# Patient Record
Sex: Female | Born: 1945 | Hispanic: No | Marital: Married | Smoking: Never smoker
Health system: Southern US, Community
[De-identification: ages and names within clinical notes are randomized; demographics above are authoritative.]

## PROBLEM LIST (undated history)

## (undated) DIAGNOSIS — I5030 Unspecified diastolic (congestive) heart failure: Secondary | ICD-10-CM

## (undated) DIAGNOSIS — I341 Nonrheumatic mitral (valve) prolapse: Secondary | ICD-10-CM

## (undated) DIAGNOSIS — N183 Chronic kidney disease, stage 3 unspecified: Secondary | ICD-10-CM

## (undated) DIAGNOSIS — E785 Hyperlipidemia, unspecified: Secondary | ICD-10-CM

## (undated) DIAGNOSIS — F32A Depression, unspecified: Secondary | ICD-10-CM

## (undated) DIAGNOSIS — R011 Cardiac murmur, unspecified: Secondary | ICD-10-CM

## (undated) DIAGNOSIS — F329 Major depressive disorder, single episode, unspecified: Secondary | ICD-10-CM

## (undated) DIAGNOSIS — I10 Essential (primary) hypertension: Secondary | ICD-10-CM

---

## 1979-02-28 HISTORY — PX: TUBAL LIGATION: SHX77

## 2004-03-06 ENCOUNTER — Other Ambulatory Visit: Admission: RE | Admit: 2004-03-06 | Discharge: 2004-03-06 | Payer: Self-pay | Admitting: Family Medicine

## 2005-03-10 ENCOUNTER — Other Ambulatory Visit: Admission: RE | Admit: 2005-03-10 | Discharge: 2005-03-10 | Payer: Self-pay | Admitting: Family Medicine

## 2006-03-22 ENCOUNTER — Other Ambulatory Visit: Admission: RE | Admit: 2006-03-22 | Discharge: 2006-03-22 | Payer: Self-pay | Admitting: Family Medicine

## 2007-04-06 ENCOUNTER — Other Ambulatory Visit: Admission: RE | Admit: 2007-04-06 | Discharge: 2007-04-06 | Payer: Self-pay | Admitting: Family Medicine

## 2009-10-08 ENCOUNTER — Other Ambulatory Visit: Admission: RE | Admit: 2009-10-08 | Discharge: 2009-10-08 | Payer: Self-pay | Admitting: Family Medicine

## 2013-05-22 ENCOUNTER — Encounter (HOSPITAL_COMMUNITY): Payer: Self-pay | Admitting: Emergency Medicine

## 2013-05-22 ENCOUNTER — Inpatient Hospital Stay (HOSPITAL_COMMUNITY)
Admission: EM | Admit: 2013-05-22 | Discharge: 2013-05-30 | DRG: 493 | Disposition: A | Discharge status: Skilled Nursing Facility | Payer: Medicare Other | Attending: Orthopedic Surgery | Admitting: Orthopedic Surgery

## 2013-05-22 ENCOUNTER — Emergency Department (HOSPITAL_COMMUNITY): Payer: Medicare Other

## 2013-05-22 ENCOUNTER — Encounter (HOSPITAL_COMMUNITY): Payer: Medicare Other | Admitting: Anesthesiology

## 2013-05-22 ENCOUNTER — Encounter (HOSPITAL_COMMUNITY)
Admission: EM | Disposition: A | Discharge status: Skilled Nursing Facility | Payer: Self-pay | Source: Home / Self Care | Attending: Orthopedic Surgery

## 2013-05-22 ENCOUNTER — Emergency Department (HOSPITAL_COMMUNITY): Payer: Medicare Other | Admitting: Anesthesiology

## 2013-05-22 DIAGNOSIS — E785 Hyperlipidemia, unspecified: Secondary | ICD-10-CM | POA: Diagnosis present

## 2013-05-22 DIAGNOSIS — N183 Chronic kidney disease, stage 3 unspecified: Secondary | ICD-10-CM | POA: Diagnosis present

## 2013-05-22 DIAGNOSIS — F3289 Other specified depressive episodes: Secondary | ICD-10-CM | POA: Diagnosis present

## 2013-05-22 DIAGNOSIS — Z79899 Other long term (current) drug therapy: Secondary | ICD-10-CM

## 2013-05-22 DIAGNOSIS — D62 Acute posthemorrhagic anemia: Secondary | ICD-10-CM | POA: Diagnosis not present

## 2013-05-22 DIAGNOSIS — I059 Rheumatic mitral valve disease, unspecified: Secondary | ICD-10-CM | POA: Diagnosis present

## 2013-05-22 DIAGNOSIS — E871 Hypo-osmolality and hyponatremia: Secondary | ICD-10-CM | POA: Diagnosis present

## 2013-05-22 DIAGNOSIS — W11XXXA Fall on and from ladder, initial encounter: Secondary | ICD-10-CM | POA: Diagnosis present

## 2013-05-22 DIAGNOSIS — I129 Hypertensive chronic kidney disease with stage 1 through stage 4 chronic kidney disease, or unspecified chronic kidney disease: Secondary | ICD-10-CM | POA: Diagnosis present

## 2013-05-22 DIAGNOSIS — N289 Disorder of kidney and ureter, unspecified: Secondary | ICD-10-CM

## 2013-05-22 DIAGNOSIS — Z7982 Long term (current) use of aspirin: Secondary | ICD-10-CM

## 2013-05-22 DIAGNOSIS — S82892C Other fracture of left lower leg, initial encounter for open fracture type IIIA, IIIB, or IIIC: Secondary | ICD-10-CM

## 2013-05-22 DIAGNOSIS — F329 Major depressive disorder, single episode, unspecified: Secondary | ICD-10-CM | POA: Diagnosis present

## 2013-05-22 DIAGNOSIS — S82892B Other fracture of left lower leg, initial encounter for open fracture type I or II: Secondary | ICD-10-CM

## 2013-05-22 DIAGNOSIS — S82899B Other fracture of unspecified lower leg, initial encounter for open fracture type I or II: Principal | ICD-10-CM | POA: Diagnosis present

## 2013-05-22 HISTORY — DX: Chronic kidney disease, stage 3 (moderate): N18.3

## 2013-05-22 HISTORY — PX: ORIF ANKLE FRACTURE: SHX5408

## 2013-05-22 HISTORY — DX: Unspecified diastolic (congestive) heart failure: I50.30

## 2013-05-22 HISTORY — DX: Major depressive disorder, single episode, unspecified: F32.9

## 2013-05-22 HISTORY — PX: ORIF ANKLE FRACTURE: SUR919

## 2013-05-22 HISTORY — DX: Nonrheumatic mitral (valve) prolapse: I34.1

## 2013-05-22 HISTORY — DX: Hyperlipidemia, unspecified: E78.5

## 2013-05-22 HISTORY — DX: Depression, unspecified: F32.A

## 2013-05-22 HISTORY — DX: Chronic kidney disease, stage 3 unspecified: N18.30

## 2013-05-22 HISTORY — PX: I & D EXTREMITY: SHX5045

## 2013-05-22 HISTORY — DX: Essential (primary) hypertension: I10

## 2013-05-22 HISTORY — DX: Cardiac murmur, unspecified: R01.1

## 2013-05-22 LAB — CBC WITH DIFFERENTIAL/PLATELET
Basophils Absolute: 0 10*3/uL (ref 0.0–0.1)
Basophils Relative: 0 % (ref 0–1)
Eosinophils Absolute: 0 10*3/uL (ref 0.0–0.7)
Hemoglobin: 11.8 g/dL — ABNORMAL LOW (ref 12.0–15.0)
Lymphs Abs: 1.4 10*3/uL (ref 0.7–4.0)
MCH: 28.8 pg (ref 26.0–34.0)
MCHC: 32.8 g/dL (ref 30.0–36.0)
Monocytes Relative: 6 % (ref 3–12)
Neutro Abs: 11.8 10*3/uL — ABNORMAL HIGH (ref 1.7–7.7)
Neutrophils Relative %: 84 % — ABNORMAL HIGH (ref 43–77)
Platelets: 216 10*3/uL (ref 150–400)
RDW: 14.6 % (ref 11.5–15.5)
WBC: 14 10*3/uL — ABNORMAL HIGH (ref 4.0–10.5)

## 2013-05-22 LAB — BASIC METABOLIC PANEL
BUN: 19 mg/dL (ref 6–23)
Chloride: 96 mEq/L (ref 96–112)
Creatinine, Ser: 1.14 mg/dL — ABNORMAL HIGH (ref 0.50–1.10)
GFR calc Af Amer: 57 mL/min — ABNORMAL LOW (ref >90)
GFR calc non Af Amer: 49 mL/min — ABNORMAL LOW (ref >90)
Glucose, Bld: 119 mg/dL — ABNORMAL HIGH (ref 70–99)
Potassium: 3.6 mEq/L (ref 3.5–5.1)

## 2013-05-22 SURGERY — OPEN REDUCTION INTERNAL FIXATION (ORIF) ANKLE FRACTURE
Anesthesia: General | Site: Ankle | Laterality: Left | Wound class: Dirty or Infected

## 2013-05-22 MED ORDER — PHENYLEPHRINE HCL 10 MG/ML IJ SOLN
INTRAMUSCULAR | Status: DC | PRN
  Administered 2013-05-22 (×3): 80 ug via INTRAVENOUS

## 2013-05-22 MED ORDER — LIDOCAINE HCL (CARDIAC) 20 MG/ML IV SOLN
INTRAVENOUS | Status: DC | PRN
  Administered 2013-05-22: 100 mg via INTRAVENOUS

## 2013-05-22 MED ORDER — FENTANYL CITRATE 0.05 MG/ML IJ SOLN: 50.0000 ug | Freq: Once | INTRAMUSCULAR | Status: DC

## 2013-05-22 MED ORDER — LACTATED RINGERS IV SOLN
INTRAVENOUS | Status: DC | PRN
  Administered 2013-05-22 (×2): via INTRAVENOUS

## 2013-05-22 MED ORDER — FENTANYL CITRATE 0.05 MG/ML IJ SOLN
INTRAMUSCULAR | Status: AC
  Administered 2013-05-22: 50 ug
  Filled 2013-05-22: qty 2

## 2013-05-22 MED ORDER — CEFAZOLIN SODIUM 1-5 GM-% IV SOLN
INTRAVENOUS | Status: DC | PRN
  Administered 2013-05-22: 1 g via INTRAVENOUS

## 2013-05-22 MED ORDER — PROPOFOL 10 MG/ML IV BOLUS
INTRAVENOUS | Status: DC | PRN
  Administered 2013-05-22: 100 mg via INTRAVENOUS

## 2013-05-22 MED ORDER — SODIUM CHLORIDE 0.9 % IR SOLN
Status: DC | PRN
  Administered 2013-05-22: 1000 mL

## 2013-05-22 MED ORDER — CEFAZOLIN SODIUM 1-5 GM-% IV SOLN
1.0000 g | Freq: Once | INTRAVENOUS | Status: AC
  Administered 2013-05-22: 1 g via INTRAVENOUS
  Filled 2013-05-22: qty 50

## 2013-05-22 MED ORDER — TETANUS-DIPHTH-ACELL PERTUSSIS 5-2.5-18.5 LF-MCG/0.5 IM SUSP
0.5000 mL | Freq: Once | INTRAMUSCULAR | Status: AC
  Administered 2013-05-22: 0.5 mL via INTRAMUSCULAR
  Filled 2013-05-22: qty 0.5

## 2013-05-22 MED ORDER — ONDANSETRON HCL 4 MG/2ML IJ SOLN
4.0000 mg | Freq: Once | INTRAMUSCULAR | Status: AC
  Administered 2013-05-22: 4 mg via INTRAVENOUS
  Filled 2013-05-22: qty 2

## 2013-05-22 MED ORDER — SUCCINYLCHOLINE CHLORIDE 20 MG/ML IJ SOLN
INTRAMUSCULAR | Status: DC | PRN
  Administered 2013-05-22: 100 mg via INTRAVENOUS

## 2013-05-22 MED ORDER — EPHEDRINE SULFATE 50 MG/ML IJ SOLN
INTRAMUSCULAR | Status: DC | PRN
  Administered 2013-05-22 (×2): 10 mg via INTRAVENOUS

## 2013-05-22 MED ORDER — HYDROMORPHONE HCL PF 1 MG/ML IJ SOLN
1.0000 mg | Freq: Once | INTRAMUSCULAR | Status: AC
  Administered 2013-05-22: 1 mg via INTRAVENOUS
  Filled 2013-05-22: qty 1

## 2013-05-22 MED ORDER — FENTANYL CITRATE 0.05 MG/ML IJ SOLN
INTRAMUSCULAR | Status: DC | PRN
  Administered 2013-05-22: 75 ug via INTRAVENOUS
  Administered 2013-05-23: 25 ug via INTRAVENOUS

## 2013-05-22 MED ORDER — CEFAZOLIN SODIUM 1-5 GM-% IV SOLN
INTRAVENOUS | Status: AC
  Filled 2013-05-22: qty 50

## 2013-05-22 SURGICAL SUPPLY — 59 items
BANDAGE ELASTIC 6 VELCRO ST LF (GAUZE/BANDAGES/DRESSINGS) ×3 IMPLANT
BANDAGE GAUZE ELAST BULKY 4 IN (GAUZE/BANDAGES/DRESSINGS) ×3 IMPLANT
BIT DRILL CANN 2.7 (BIT) ×1
BIT DRILL SRG 2.7XCANN AO CPLG (BIT) ×2 IMPLANT
BIT DRL SRG 2.7XCANN AO CPLNG (BIT) ×2
BLADE SURG 10 STRL SS (BLADE) ×3 IMPLANT
BNDG COHESIVE 4X5 TAN STRL (GAUZE/BANDAGES/DRESSINGS) ×3 IMPLANT
BNDG ESMARK 4X9 LF (GAUZE/BANDAGES/DRESSINGS) ×3 IMPLANT
CANISTER SUCTION 2500CC (MISCELLANEOUS) ×3 IMPLANT
CLOTH BEACON ORANGE TIMEOUT ST (SAFETY) ×3 IMPLANT
COVER SURGICAL LIGHT HANDLE (MISCELLANEOUS) ×3 IMPLANT
CUFF TOURNIQUET SINGLE 34IN LL (TOURNIQUET CUFF) ×3 IMPLANT
DRAPE C-ARM 42X72 X-RAY (DRAPES) ×3 IMPLANT
DRAPE C-ARMOR (DRAPES) ×3 IMPLANT
DRILL 2.6X122MM WL AO SHAFT (BIT) ×6 IMPLANT
DRSG ADAPTIC 3X8 NADH LF (GAUZE/BANDAGES/DRESSINGS) ×3 IMPLANT
DURAPREP 26ML APPLICATOR (WOUND CARE) ×3 IMPLANT
ELECT REM PT RETURN 9FT ADLT (ELECTROSURGICAL)
ELECTRODE REM PT RTRN 9FT ADLT (ELECTROSURGICAL) IMPLANT
FACESHIELD LNG OPTICON STERILE (SAFETY) ×9 IMPLANT
GLOVE BIO SURGEON STRL SZ8 (GLOVE) ×6 IMPLANT
GLOVE ORTHO TXT STRL SZ7.5 (GLOVE) ×9 IMPLANT
GOWN PREVENTION PLUS XXLARGE (GOWN DISPOSABLE) ×3 IMPLANT
GOWN STRL NON-REIN LRG LVL3 (GOWN DISPOSABLE) ×9 IMPLANT
K-WIRE ORTHOPEDIC 1.4X150L (WIRE) ×6
KIT BASIN OR (CUSTOM PROCEDURE TRAY) ×3 IMPLANT
KWIRE ORTHOPEDIC 1.4X150L (WIRE) ×4 IMPLANT
NS IRRIG 1000ML POUR BTL (IV SOLUTION) ×3 IMPLANT
PACK ORTHO EXTREMITY (CUSTOM PROCEDURE TRAY) ×3 IMPLANT
PAD ARMBOARD 7.5X6 YLW CONV (MISCELLANEOUS) ×6 IMPLANT
PAD CAST 4YDX4 CTTN HI CHSV (CAST SUPPLIES) ×2 IMPLANT
PADDING CAST ABS 4INX4YD NS (CAST SUPPLIES) ×1
PADDING CAST ABS COTTON 4X4 ST (CAST SUPPLIES) ×2 IMPLANT
PADDING CAST COTTON 4X4 STRL (CAST SUPPLIES) ×1
PENCIL BUTTON HOLSTER BLD 10FT (ELECTRODE) IMPLANT
PLATE 7H 96MM (Plate) ×3 IMPLANT
SCREW 3.5X10MM (Screw) ×3 IMPLANT
SCREW BONE 14MMX3.5MM (Screw) ×3 IMPLANT
SCREW BONE 3.5X16MM (Screw) ×3 IMPLANT
SCREW BONE 50MMX3.5MM (Screw) ×3 IMPLANT
SCREW CANNULATED TI 4.0X44 (Screw) ×6 IMPLANT
SCREW LOCK 3.5X14 (Screw) ×3 IMPLANT
SCREW LOCKING 3.5X40 (Screw) ×3 IMPLANT
SPONGE GAUZE 4X4 12PLY (GAUZE/BANDAGES/DRESSINGS) ×3 IMPLANT
SPONGE LAP 18X18 X RAY DECT (DISPOSABLE) IMPLANT
STOCKINETTE IMPERVIOUS 9X36 MD (GAUZE/BANDAGES/DRESSINGS) ×3 IMPLANT
SUT ETHILON 3 0 PS 1 (SUTURE) ×6 IMPLANT
SUT MNCRL AB 4-0 PS2 18 (SUTURE) ×3 IMPLANT
SUT MON AB 2-0 CT1 36 (SUTURE) ×3 IMPLANT
SUT VIC AB 0 CT1 27 (SUTURE) ×1
SUT VIC AB 0 CT1 27XBRD ANBCTR (SUTURE) ×2 IMPLANT
SYR BULB IRRIGATION 50ML (SYRINGE) ×3 IMPLANT
TOWEL OR 17X24 6PK STRL BLUE (TOWEL DISPOSABLE) ×6 IMPLANT
TOWEL OR 17X26 10 PK STRL BLUE (TOWEL DISPOSABLE) ×3 IMPLANT
TUBE CONNECTING 12X1/4 (SUCTIONS) ×3 IMPLANT
UNDERPAD 30X30 INCONTINENT (UNDERPADS AND DIAPERS) ×3 IMPLANT
WASHER FOR 4.0 SCREWS (Washer) ×3 IMPLANT
WATER STERILE IRR 1000ML POUR (IV SOLUTION) ×3 IMPLANT
YANKAUER SUCT BULB TIP NO VENT (SUCTIONS) ×3 IMPLANT

## 2013-05-22 NOTE — Preoperative (Signed)
Beta Blockers   Reason not to administer Beta Blockers:Not Applicable 

## 2013-05-22 NOTE — ED Notes (Signed)
Per EMS pt fell off off ladder about 3 feet; states landed on feet possible left open ankle fx. Pt received 6mg  Morphine prior to arrival .

## 2013-05-22 NOTE — Anesthesia Preprocedure Evaluation (Signed)
Anesthesia Evaluation  Patient identified by MRN, date of birth, ID band Patient awake    Reviewed: Allergy & Precautions, H&P , NPO status , Patient's Chart, lab work & pertinent test results  Airway Mallampati: I TM Distance: >3 FB Neck ROM: full    Dental   Pulmonary          Cardiovascular hypertension, + Valvular Problems/Murmurs MVP Rhythm:regular Rate:Normal     Neuro/Psych    GI/Hepatic   Endo/Other    Renal/GU Renal disease     Musculoskeletal   Abdominal   Peds  Hematology   Anesthesia Other Findings   Reproductive/Obstetrics                           Anesthesia Physical Anesthesia Plan  ASA: II  Anesthesia Plan: General   Post-op Pain Management:    Induction: Intravenous  Airway Management Planned: Oral ETT and LMA  Additional Equipment:   Intra-op Plan:   Post-operative Plan: Extubation in OR  Informed Consent: I have reviewed the patients History and Physical, chart, labs and discussed the procedure including the risks, benefits and alternatives for the proposed anesthesia with the patient or authorized representative who has indicated his/her understanding and acceptance.     Plan Discussed with: CRNA, Anesthesiologist and Surgeon  Anesthesia Plan Comments:         Anesthesia Quick Evaluation

## 2013-05-22 NOTE — Anesthesia Procedure Notes (Addendum)
Procedure Name: Intubation Date/Time: 05/22/2013 11:02 PM Performed by: Molli Hazard Pre-anesthesia Checklist: Patient identified, Emergency Drugs available, Suction available and Patient being monitored Patient Re-evaluated:Patient Re-evaluated prior to inductionOxygen Delivery Method: Circle system utilized Preoxygenation: Pre-oxygenation with 100% oxygen Intubation Type: IV induction Ventilation: Mask ventilation without difficulty Laryngoscope Size: Miller and 2 Grade View: Grade I Tube type: Oral Tube size: 7.5 mm Number of attempts: 1 Airway Equipment and Method: Stylet Placement Confirmation: ETT inserted through vocal cords under direct vision,  positive ETCO2 and breath sounds checked- equal and bilateral Secured at: 20 cm Tube secured with: Tape Dental Injury: Teeth and Oropharynx as per pre-operative assessment    Performed by: Molli Hazard

## 2013-05-22 NOTE — ED Notes (Signed)
Patient requests pain medication.

## 2013-05-22 NOTE — ED Provider Notes (Signed)
CSN: [id number]     Arrival date & time 05/22/13  1902 History   First MD Initiated Contact with Patient 05/22/13 2121     Chief Complaint  Patient presents with  . Fall   (Consider location/radiation/quality/duration/timing/severity/associated sxs/prior Treatment) Patient is a 67 y.o. female presenting with fall. The history is provided by the patient.  Fall  She  [name] about 4 feet off of a ladder suffering an injury to her left foot. She was brought in by EMS with obvious open fracture to her left ankle. She denies other injury. She received morphine and root but still rates pain at 4/10. It feels better she keeps her foot still and is worse with any movement. She denies other injury. She denies loss of consciousness and denies head, neck, back, chest, abdomen injury. She does not on her last tetanus immunization was.  Past Medical History  Diagnosis Date  . Renal disorder     Stage III  . Mitral valve prolapse   . Hypertension   . Hyperlipemia    No past surgical history on file. No family history on file. History  Substance Use Topics  . Smoking status: Not on file  . Smokeless tobacco: Not on file  . Alcohol Use: No   OB History   Grav Para Term Preterm Abortions TAB SAB Ect Mult Living                 Review of Systems  All other systems reviewed and are negative.    Allergies  Review of patient's allergies indicates no known allergies.  Home Medications   Current Outpatient Rx  Name  Route  Sig  Dispense  Refill  . buPROPion (WELLBUTRIN SR) 150 MG 12 hr tablet   Oral   Take 150 mg by mouth 2 (two) times daily.          Marland Kitchen FLUoxetine (PROZAC) 20 MG capsule   Oral   Take 20 mg by mouth daily.          Marland Kitchen lisinopril-hydrochlorothiazide (PRINZIDE,ZESTORETIC) 20-12.5 MG per tablet   Oral   Take 1 tablet by mouth daily.          Marland Kitchen omega-3 acid ethyl esters (LOVAZA) 1 G capsule   Oral   Take 1 g by mouth 2 (two) times daily.         . pravastatin  (PRAVACHOL) 80 MG tablet   Oral   Take 80 mg by mouth daily.          . raloxifene (EVISTA) 60 MG tablet   Oral   Take 60 mg by mouth daily.           BP 120/56  Pulse 99  Temp(Src) 98 F (36.7 C) (Oral)  Resp 16  Ht 5\' 4"  (1.626 m)  Wt 140 lb (63.504 kg)  BMI 24.02 kg/m2  SpO2 95% Physical Exam  Nursing note and vitals reviewed.  67 year old female, resting comfortably and in no acute distress. Vital signs are normal. Oxygen saturation is 95%, which is normal. Head is normocephalic and atraumatic. PERRLA, EOMI. Oropharynx is clear. Neck is nontender and immobilized in a stiff cervical collar. There is no adenopathy or JVD. Back is nontender and there is no CVA tenderness. Lungs are clear without rales, wheezes, or rhonchi. Chest is nontender. Heart has regular rate and rhythm without murmur. Abdomen is soft, flat, nontender without masses or hepatosplenomegaly and peristalsis is normoactive. Extremities: There is an obvious open fracture  dislocation of the left ankle with the tibia protruding through a large laceration of the medial aspect of the ankle. The foot is rotated outward. Distal neurovascular exam is intact with prompt capillary refill normal sensation. Skin is warm and dry without rash. Neurologic: Mental status is normal, cranial nerves are intact, there are no motor or sensory deficits.  ED Course  Procedures (including critical care time) Labs Review Results for orders placed during the hospital encounter of 05/22/13  CBC WITH DIFFERENTIAL      Result Value Range   WBC 14.0 (*) 4.0 - 10.5 K/uL   RBC 4.10  3.87 - 5.11 MIL/uL   Hemoglobin 11.8 (*) 12.0 - 15.0 g/dL   HCT 45.4  09.8 - 11.9 %   MCV 87.8  78.0 - 100.0 fL   MCH 28.8  26.0 - 34.0 pg   MCHC 32.8  30.0 - 36.0 g/dL   RDW 14.7  82.9 - 56.2 %   Platelets 216  150 - 400 K/uL   Neutrophils Relative % 84 (*) 43 - 77 %   Neutro Abs 11.8 (*) 1.7 - 7.7 K/uL   Lymphocytes Relative 10 (*) 12 - 46 %    Lymphs Abs 1.4  0.7 - 4.0 K/uL   Monocytes Relative 6  3 - 12 %   Monocytes Absolute 0.8  0.1 - 1.0 K/uL   Eosinophils Relative 0  0 - 5 %   Eosinophils Absolute 0.0  0.0 - 0.7 K/uL   Basophils Relative 0  0 - 1 %   Basophils Absolute 0.0  0.0 - 0.1 K/uL  BASIC METABOLIC PANEL      Result Value Range   Sodium 133 (*) 135 - 145 mEq/L   Potassium 3.6  3.5 - 5.1 mEq/L   Chloride 96  96 - 112 mEq/L   CO2 27  19 - 32 mEq/L   Glucose, Bld 119 (*) 70 - 99 mg/dL   BUN 19  6 - 23 mg/dL   Creatinine, Ser 1.30 (*) 0.50 - 1.10 mg/dL   Calcium 9.2  8.4 - 86.5 mg/dL   GFR calc non Af Amer 49 (*) >90 mL/min   GFR calc Af Amer 57 (*) >90 mL/min   Imaging Review Dg Ankle 2 Views Left  05/22/2013   CLINICAL DATA:  Recent traumatic injury with ankle pain  EXAM: LEFT ANKLE - 2 VIEW  COMPARISON:  None.  FINDINGS: There fractures of both the distal tibia and distal fibula. Significant deformation is noted with posterior displacement of the tibia with respect to the talus as well as apparent lateral displacement and rotation of the foot with respect to the tibia. The distal fibular fragment appears to be appropriately located with respect to the talus.  IMPRESSION: Significant fracture dislocation of the ankle joint with apparent posterior medial displacement of the tibia with respect to the talus and remainder of the foot.   Electronically Signed   By: Alcide Clever M.D.   On: 05/22/2013 21:13   Images viewed by me.  EKG Interpretation    Date/Time:  Monday May 22 2013 21:51:26 EST Ventricular Rate:  122 PR Interval:  161 QRS Duration: 69 QT Interval:  331 QTC Calculation: 471 R Axis:   35 Text Interpretation:  Sinus tachycardia Consider right atrial enlargement Low voltage, precordial leads Borderline repolarization abnormality No old tracing to compare Confirmed by [facility]  MD, Katherleen Folkes ([id number]) on 05/22/2013 10:12:45 PM  CRITICAL CARE Performed by: Dione Booze Total critical care  time: 35 minutes Critical care time was exclusive of separately billable procedures and treating other patients. Critical care was necessary to treat or prevent imminent or life-threatening deterioration. Critical care was time spent personally by me on the following activities: development of treatment plan with patient and/or surrogate as well as nursing, discussions with consultants, evaluation of patient's response to treatment, examination of patient, obtaining history from patient or surrogate, ordering and performing treatments and interventions, ordering and review of laboratory studies, ordering and review of radiographic studies, pulse oximetry and re-evaluation of patient's condition.  MDM   1. Hyponatremia   2. Renal insufficiency   3. Fracture dislocation or subluxation of ankle, left, open type III, initial encounter    Open fracture dislocation of the left ankle. Case is been discussed with Dr. Eulah Pont who is on call for orthopedics was requested that we attempt to reduce the fracture in the ED and he is coming into that the patient and consideration given to taking her to the operating room tonight. Attempt was made to reduce the ankle was unsuccessful. CT of the cervical spine is being delayed to allow emergent management of open fracture dislocation.    Dione Booze, MD 05/23/13 [phone]

## 2013-05-22 NOTE — ED Notes (Addendum)
Patient has an open ankle fracture that is a 10/10 pain scale. She is requesting pain medication relief.

## 2013-05-22 NOTE — Consult Note (Signed)
   ORTHOPAEDIC CONSULTATION  REQUESTING PHYSICIAN: David Glick, MD  Chief Complaint: Left ankle open fracture  HPI: Charnika Bourgoin is a 67 y.o. female who complains of  Fall from a ladder cleaning her gazebo. She lay on the ground for a few hours prior to being found.   Past Medical History  Diagnosis Date  . Renal disorder     Stage III  . Mitral valve prolapse   . Hypertension   . Hyperlipemia    No past surgical history on file. History   Social History  . Marital Status: Married    Spouse Name: N/A    Number of Children: N/A  . Years of Education: N/A   Social History Main Topics  . Smoking status: Not on file  . Smokeless tobacco: Not on file  . Alcohol Use: No  . Drug Use: No  . Sexual Activity: Not on file   Other Topics Concern  . Not on file   Social History Narrative  . No narrative on file   No family history on file. No Known Allergies Prior to Admission medications   Medication Sig Start Date End Date Taking? Authorizing Provider  buPROPion (WELLBUTRIN SR) 150 MG 12 hr tablet Take 150 mg by mouth 2 (two) times daily.  05/03/13  Yes Historical Provider, MD  FLUoxetine (PROZAC) 20 MG capsule Take 20 mg by mouth daily.  05/03/13  Yes Historical Provider, MD  lisinopril-hydrochlorothiazide (PRINZIDE,ZESTORETIC) 20-12.5 MG per tablet Take 1 tablet by mouth daily.  05/03/13  Yes Historical Provider, MD  omega-3 acid ethyl esters (LOVAZA) 1 G capsule Take 1 g by mouth 2 (two) times daily.   Yes Historical Provider, MD  pravastatin (PRAVACHOL) 80 MG tablet Take 80 mg by mouth daily.  05/03/13  Yes Historical Provider, MD  raloxifene (EVISTA) 60 MG tablet Take 60 mg by mouth daily.  05/03/13  Yes Historical Provider, MD   Dg Ankle 2 Views Left  05/22/2013   CLINICAL DATA:  Recent traumatic injury with ankle pain  EXAM: LEFT ANKLE - 2 VIEW  COMPARISON:  None.  FINDINGS: There fractures of both the distal tibia and distal fibula. Significant deformation is noted  with posterior displacement of the tibia with respect to the talus as well as apparent lateral displacement and rotation of the foot with respect to the tibia. The distal fibular fragment appears to be appropriately located with respect to the talus.  IMPRESSION: Significant fracture dislocation of the ankle joint with apparent posterior medial displacement of the tibia with respect to the talus and remainder of the foot.   Electronically Signed   By: Mark  Lukens M.D.   On: 05/22/2013 21:13    Positive ROS: All other systems have been reviewed and were otherwise negative with the exception of those mentioned in the HPI and as above.  Labs cbc No results found for this basename: WBC, HGB, HCT, PLT,  in the last 72 hours  Labs inflam No results found for this basename: ESR, CRP,  in the last 72 hours  Labs coag No results found for this basename: INR, PT, PTT,  in the last 72 hours  No results found for this basename: NA, K, CL, CO2, GLUCOSE, BUN, CREATININE, CALCIUM,  in the last 72 hours  Physical Exam: Filed Vitals:   05/22/13 2000  BP: 120/56  Pulse: 99  Temp:   Resp:    General: Alert, no acute distress Cardiovascular: No pedal edema Respiratory: No cyanosis, no use   of accessory musculature GI: No organomegaly, abdomen is soft and non-tender Skin: No lesions in the area of chief complaint Neurologic: Sensation intact distally Psychiatric: Patient is competent for consent with normal mood and affect Lymphatic: No axillary or cervical lymphadenopathy  MUSCULOSKELETAL:  LLE: medial laceration with exposed tiba. Wiggles toes, 2+DP, SILT DP/SP/S/S/T.  Other extremities are atraumatic with painless ROM and NVI.  Assessment: Left ankle fracture open  Plan: OR emergently for I&D, ex-fix vs. ORIF depending on skin status Weight Bearing Status: NWB LLE PT/OT VTE px: SCD's and ASA 325 post op.    Jadore Mcguffin, D, MD Cell [phone]   05/22/2013 9:50 PM     

## 2013-05-22 NOTE — ED Notes (Signed)
Dr. Preston Fleeting at bedside attempting to relocate tibia into ankle. Unable to do so.

## 2013-05-23 ENCOUNTER — Encounter (HOSPITAL_COMMUNITY): Payer: Self-pay | Admitting: *Deleted

## 2013-05-23 ENCOUNTER — Observation Stay (HOSPITAL_COMMUNITY): Payer: Medicare Other

## 2013-05-23 ENCOUNTER — Emergency Department (HOSPITAL_COMMUNITY): Payer: Medicare Other

## 2013-05-23 MED ORDER — CEFAZOLIN SODIUM-DEXTROSE 2-3 GM-% IV SOLR
2.0000 g | Freq: Four times a day (QID) | INTRAVENOUS | Status: AC
  Administered 2013-05-23 (×3): 2 g via INTRAVENOUS
  Filled 2013-05-23 (×3): qty 50

## 2013-05-23 MED ORDER — BUPROPION HCL ER (SR) 150 MG PO TB12
150.0000 mg | ORAL_TABLET | Freq: Two times a day (BID) | ORAL | Status: DC
  Administered 2013-05-23 – 2013-05-30 (×15): 150 mg via ORAL
  Filled 2013-05-23 (×17): qty 1

## 2013-05-23 MED ORDER — HYDROMORPHONE HCL PF 1 MG/ML IJ SOLN
INTRAMUSCULAR | Status: AC
  Filled 2013-05-23: qty 1

## 2013-05-23 MED ORDER — DEXTROSE-NACL 5-0.45 % IV SOLN
INTRAVENOUS | Status: AC
  Administered 2013-05-23: 04:00:00 via INTRAVENOUS

## 2013-05-23 MED ORDER — ONDANSETRON HCL 4 MG PO TABS: 4.0000 mg | ORAL_TABLET | Freq: Four times a day (QID) | ORAL | Status: DC | PRN

## 2013-05-23 MED ORDER — METOCLOPRAMIDE HCL 5 MG/ML IJ SOLN: 5.0000 mg | Freq: Three times a day (TID) | INTRAMUSCULAR | Status: DC | PRN

## 2013-05-23 MED ORDER — OXYCODONE HCL 5 MG PO TABS: 5.0000 mg | ORAL_TABLET | ORAL | Status: AC | PRN

## 2013-05-23 MED ORDER — METHOCARBAMOL 100 MG/ML IJ SOLN: 500.0000 mg | Freq: Four times a day (QID) | INTRAVENOUS | Status: DC | PRN

## 2013-05-23 MED ORDER — ACETAMINOPHEN 325 MG PO TABS
650.0000 mg | ORAL_TABLET | Freq: Four times a day (QID) | ORAL | Status: DC | PRN
  Administered 2013-05-23: 650 mg via ORAL
  Filled 2013-05-23 (×2): qty 2

## 2013-05-23 MED ORDER — OXYCODONE HCL 5 MG PO TABS
5.0000 mg | ORAL_TABLET | ORAL | Status: DC | PRN
  Administered 2013-05-23: 5 mg via ORAL
  Administered 2013-05-23 – 2013-05-24 (×7): 10 mg via ORAL
  Administered 2013-05-25: 5 mg via ORAL
  Administered 2013-05-25 (×2): 10 mg via ORAL
  Administered 2013-05-26: 5 mg via ORAL
  Administered 2013-05-26: 10 mg via ORAL
  Administered 2013-05-28: 5 mg via ORAL
  Administered 2013-05-29: 10 mg via ORAL
  Filled 2013-05-23 (×2): qty 2
  Filled 2013-05-23: qty 1
  Filled 2013-05-23 (×2): qty 2
  Filled 2013-05-23: qty 1
  Filled 2013-05-23 (×2): qty 2
  Filled 2013-05-23: qty 1
  Filled 2013-05-23 (×5): qty 2
  Filled 2013-05-23: qty 1

## 2013-05-23 MED ORDER — ONDANSETRON HCL 4 MG/2ML IJ SOLN
INTRAMUSCULAR | Status: DC | PRN
  Administered 2013-05-23: 4 mg via INTRAVENOUS

## 2013-05-23 MED ORDER — METHOCARBAMOL 500 MG PO TABS
500.0000 mg | ORAL_TABLET | Freq: Four times a day (QID) | ORAL | Status: DC | PRN
  Administered 2013-05-23 – 2013-05-29 (×8): 500 mg via ORAL
  Filled 2013-05-23 (×8): qty 1

## 2013-05-23 MED ORDER — FLUOXETINE HCL 20 MG PO CAPS
20.0000 mg | ORAL_CAPSULE | Freq: Every day | ORAL | Status: DC
  Administered 2013-05-23 – 2013-05-30 (×8): 20 mg via ORAL
  Filled 2013-05-23 (×9): qty 1

## 2013-05-23 MED ORDER — ONDANSETRON HCL 4 MG/2ML IJ SOLN: 4.0000 mg | Freq: Four times a day (QID) | INTRAMUSCULAR | Status: DC | PRN

## 2013-05-23 MED ORDER — ACETAMINOPHEN 325 MG PO TABS
650.0000 mg | ORAL_TABLET | ORAL | Status: DC | PRN
  Administered 2013-05-23 – 2013-05-25 (×3): 650 mg via ORAL
  Filled 2013-05-23: qty 2

## 2013-05-23 MED ORDER — ONDANSETRON HCL 4 MG/2ML IJ SOLN: 4.0000 mg | Freq: Once | INTRAMUSCULAR | Status: DC | PRN

## 2013-05-23 MED ORDER — METOCLOPRAMIDE HCL 5 MG PO TABS
5.0000 mg | ORAL_TABLET | Freq: Three times a day (TID) | ORAL | Status: DC | PRN
Start: 2013-05-23 — End: 2013-05-30

## 2013-05-23 MED ORDER — ASPIRIN EC 325 MG PO TBEC
325.0000 mg | DELAYED_RELEASE_TABLET | Freq: Every day | ORAL | Status: DC
  Administered 2013-05-23 – 2013-05-30 (×8): 325 mg via ORAL
  Filled 2013-05-23 (×9): qty 1

## 2013-05-23 MED ORDER — MORPHINE SULFATE 2 MG/ML IJ SOLN
1.0000 mg | INTRAMUSCULAR | Status: DC | PRN
  Administered 2013-05-23 – 2013-05-24 (×5): 1 mg via INTRAVENOUS
  Filled 2013-05-23 (×5): qty 1

## 2013-05-23 MED ORDER — HYDROMORPHONE HCL PF 1 MG/ML IJ SOLN
0.2500 mg | INTRAMUSCULAR | Status: DC | PRN
  Administered 2013-05-23 (×3): 0.5 mg via INTRAVENOUS

## 2013-05-23 MED ORDER — RALOXIFENE HCL 60 MG PO TABS
60.0000 mg | ORAL_TABLET | Freq: Every day | ORAL | Status: DC
  Administered 2013-05-23 – 2013-05-30 (×8): 60 mg via ORAL
  Filled 2013-05-23 (×8): qty 1

## 2013-05-23 NOTE — Transfer of Care (Signed)
Immediate Anesthesia Transfer of Care Note  Patient: Charlene Watts  Procedure(s) Performed: Procedure(s): IRRIGATION AND DEBRIDEMENT EXTREMITY (Left) OPEN REDUCTION INTERNAL FIXATION (ORIF) ANKLE FRACTURE (Left)  Patient Location: PACU  Anesthesia Type:General  Level of Consciousness: awake, sedated and patient cooperative  Airway & Oxygen Therapy: Patient Spontanous Breathing and Patient connected to nasal cannula oxygen  Post-op Assessment: Report given to PACU RN, Post -op Vital signs reviewed and stable and Patient moving all extremities X 4  Post vital signs: Reviewed and stable  Complications: No apparent anesthesia complications

## 2013-05-23 NOTE — H&P (Signed)
ORTHOPAEDIC CONSULTATION  REQUESTING PHYSICIAN: Dione Booze, MD  Chief Complaint: Left ankle open fracture  HPI: Charlene Watts is a 67 y.o. female who complains of  Fall from a ladder cleaning her gazebo. She lay on the ground for a few hours prior to being found.   Past Medical History  Diagnosis Date  . Renal disorder     Stage III  . Mitral valve prolapse   . Hypertension   . Hyperlipemia    No past surgical history on file. History   Social History  . Marital Status: Married    Spouse Name: N/A    Number of Children: N/A  . Years of Education: N/A   Social History Main Topics  . Smoking status: Not on file  . Smokeless tobacco: Not on file  . Alcohol Use: No  . Drug Use: No  . Sexual Activity: Not on file   Other Topics Concern  . Not on file   Social History Narrative  . No narrative on file   No family history on file. No Known Allergies Prior to Admission medications   Medication Sig Start Date End Date Taking? Authorizing Provider  buPROPion (WELLBUTRIN SR) 150 MG 12 hr tablet Take 150 mg by mouth 2 (two) times daily.  05/03/13  Yes Historical Provider, MD  FLUoxetine (PROZAC) 20 MG capsule Take 20 mg by mouth daily.  05/03/13  Yes Historical Provider, MD  lisinopril-hydrochlorothiazide (PRINZIDE,ZESTORETIC) 20-12.5 MG per tablet Take 1 tablet by mouth daily.  05/03/13  Yes Historical Provider, MD  omega-3 acid ethyl esters (LOVAZA) 1 G capsule Take 1 g by mouth 2 (two) times daily.   Yes Historical Provider, MD  pravastatin (PRAVACHOL) 80 MG tablet Take 80 mg by mouth daily.  05/03/13  Yes Historical Provider, MD  raloxifene (EVISTA) 60 MG tablet Take 60 mg by mouth daily.  05/03/13  Yes Historical Provider, MD   Dg Ankle 2 Views Left  05/22/2013   CLINICAL DATA:  Recent traumatic injury with ankle pain  EXAM: LEFT ANKLE - 2 VIEW  COMPARISON:  None.  FINDINGS: There fractures of both the distal tibia and distal fibula. Significant deformation is noted  with posterior displacement of the tibia with respect to the talus as well as apparent lateral displacement and rotation of the foot with respect to the tibia. The distal fibular fragment appears to be appropriately located with respect to the talus.  IMPRESSION: Significant fracture dislocation of the ankle joint with apparent posterior medial displacement of the tibia with respect to the talus and remainder of the foot.   Electronically Signed   By: Alcide Clever M.D.   On: 05/22/2013 21:13    Positive ROS: All other systems have been reviewed and were otherwise negative with the exception of those mentioned in the HPI and as above.  Labs cbc No results found for this basename: WBC, HGB, HCT, PLT,  in the last 72 hours  Labs inflam No results found for this basename: ESR, CRP,  in the last 72 hours  Labs coag No results found for this basename: INR, PT, PTT,  in the last 72 hours  No results found for this basename: NA, K, CL, CO2, GLUCOSE, BUN, CREATININE, CALCIUM,  in the last 72 hours  Physical Exam: Filed Vitals:   05/22/13 2000  BP: 120/56  Pulse: 99  Temp:   Resp:    General: Alert, no acute distress Cardiovascular: No pedal edema Respiratory: No cyanosis, no use  of accessory musculature GI: No organomegaly, abdomen is soft and non-tender Skin: No lesions in the area of chief complaint Neurologic: Sensation intact distally Psychiatric: Patient is competent for consent with normal mood and affect Lymphatic: No axillary or cervical lymphadenopathy  MUSCULOSKELETAL:  LLE: medial laceration with exposed tiba. Wiggles toes, 2+DP, SILT DP/SP/S/S/T.  Other extremities are atraumatic with painless ROM and NVI.  Assessment: Left ankle fracture open  Plan: OR emergently for I&D, ex-fix vs. ORIF depending on skin status Weight Bearing Status: NWB LLE PT/OT VTE px: SCD's and ASA 325 post op.    Margarita Rana, D, MD Cell [phone]   05/22/2013 9:50 PM

## 2013-05-23 NOTE — Evaluation (Signed)
Occupational Therapy Evaluation Patient Details Name: Charlene Watts MRN: [id number] DOB: February 02, 1946 Today's Date: 05/23/2013 Time: 4098-1191 OT Time Calculation (min): 33 min  OT Assessment / Plan / Recommendation History of present illness She [name] about 4 feet off of a ladder suffering an injury to her left foot. She was brought in by EMS with obvious open fracture to her left ankle. She denies other injury. She received morphine and root but still rates pain at 4/10. It feels better she keeps her foot still and is worse with any movement. She denies other injury. She denies loss of consciousness and denies head, neck, back, chest, abdomen injury. She does not on her last tetanus immunization was.  Pt is now s/p I& D and ORIF left ankle.   Clinical Impression   PT IS UNSAFE TO D/C at this time. Pt has poor safety awareness and high fall risk. Pt became nauseated and diaphoretic with our short 30 minute session. Pt will have little to no family support at d/c home. Pt reports she has a 79 yo granddaughter can help her maybe when she is not in school. Pt in primary caregiver for spouse. Question who will be able to drive patient home and help manage both patient/ spouse at this time. Pt with poor recall of education.    OT Assessment  Patient needs continued OT Services    Follow Up Recommendations  Home health OT;Supervision/Assistance - 24 hour (unable to currently transfer without (A))    Barriers to Discharge      Equipment Recommendations  3 in 1 bedside comode;Other (comment);Wheelchair (measurements OT);Wheelchair cushion (measurements OT) (RW: BIL elevating leg rest)    Recommendations for Other Services    Frequency  Min 2X/week    Precautions / Restrictions Precautions Precautions: Fall Restrictions Weight Bearing Restrictions: Yes LLE Weight Bearing: Non weight bearing   Pertinent Vitals/Pain Nauseated with mobility    ADL  Toilet Transfer: Minimal assistance Toilet  Transfer Method: Sit to stand Toilet Transfer Equipment: Raised toilet seat with arms (or 3-in-1 over toilet) (3n1 by bedside unable to complete full transfer to the bathr) Toileting - Clothing Manipulation and Hygiene: Minimal assistance Where Assessed - Toileting Clothing Manipulation and Hygiene: Sit to stand from 3-in-1 or toilet Equipment Used: Gait belt;Rolling walker Transfers/Ambulation Related to ADLs: Pt unsteady using RW and needs reinforcement for safety. Pt reports "I can manage" pt unaware of deficits at this time ADL Comments: Pt requires (A) for all transfers and unable to complete basic transfer distance required at home. ( eob to toilet). Pt will have very limited family support upon d/c home. Pt reports being primary caregiver for spouse. Pt with very flat affect and unaware of deficits affecting dc plan. Pt demonstrates durnig session inability to get inside the home. inability to exit home in case of fire, inability to obtain food, inability to complete basic adls in bathroom and prolonged time to exit bed. Pt is NOT Safe to d/c at this time. RN notified and requesting patient does not d/c home until further arrangements can be made. Pt is too unsteady and high fall risk.     OT Diagnosis: Generalized weakness;Cognitive deficits;Acute pain  OT Problem List: Decreased strength;Decreased activity tolerance;Impaired balance (sitting and/or standing);Decreased cognition;Decreased safety awareness;Decreased knowledge of use of DME or AE;Decreased knowledge of precautions;Pain OT Treatment Interventions: Self-care/ADL training;Therapeutic exercise;DME and/or AE instruction;Therapeutic activities;Cognitive remediation/compensation;Patient/family education;Balance training   OT Goals(Current goals can be found in the care plan section) Acute Rehab OT Goals Patient  Stated Goal: to go home and manage OT Goal Formulation: With patient Time For Goal Achievement: 06/06/13 Potential to  Achieve Goals: Good  Visit Information  Last OT Received On: 05/23/13 Assistance Needed: +1 PT/OT Co-Evaluation/Treatment: Yes History of Present Illness: She [name] about 4 feet off of a ladder suffering an injury to her left foot. She was brought in by EMS with obvious open fracture to her left ankle. She denies other injury. She received morphine and root but still rates pain at 4/10. It feels better she keeps her foot still and is worse with any movement. She denies other injury. She denies loss of consciousness and denies head, neck, back, chest, abdomen injury. She does not on her last tetanus immunization was.  Pt is now s/p I& D and ORIF left ankle.       Prior Functioning     Home Living Family/patient expects to be discharged to:: Private residence Living Arrangements: Spouse/significant other Available Help at Discharge: Family Type of Home: House Home Access: Stairs to enter Secretary/administrator of Steps: 3 Home Layout: One level Home Equipment: Environmental consultant - 2 wheels;Shower seat;Cane - single point Prior Function Level of Independence: Independent Communication Communication: No difficulties         Vision/Perception     Cognition  Cognition Arousal/Alertness: Awake/alert Behavior During Therapy: Flat affect;Impulsive Overall Cognitive Status: Impaired/Different from baseline Area of Impairment: Memory;Safety/judgement;Problem solving Memory: Decreased short-term memory Safety/Judgement: Decreased awareness of safety;Decreased awareness of deficits Problem Solving: Slow processing;Requires verbal cues;Requires tactile cues General Comments: Patient VC'd several times for hand plccement with no carryover, kept stating that she could manage and do things at home but despite inability to perform simple tasks here with therapies    Extremity/Trunk Assessment Upper Extremity Assessment Upper Extremity Assessment: Overall WFL for tasks assessed Lower Extremity  Assessment Lower Extremity Assessment: Defer to PT evaluation     Mobility Bed Mobility Bed Mobility: Supine to Sit;Sitting - Scoot to Edge of Bed Supine to Sit: 4: Min guard Sitting - Scoot to [place] of Bed: 5: Supervision Details for Bed Mobility Assistance: increased time to perform, Cues for LLE movement  Transfers Sit to Stand: 4: Min assist Stand to Sit: 4: Min assist Details for Transfer Assistance: Max VCs for hand placement and safety with no carryover demonstrated.  Min assist for stability secondary to posterior lean and poor positioing of LLE to maintain NWBing     Exercise     Balance High Level Balance High Level Balance Activites: Side stepping;Turns High Level Balance Comments: poor stability as pt attempting to move to quickly with poor positioning within RW   End of Session OT - End of Session Activity Tolerance: Patient tolerated treatment well Patient left: in chair;with call bell/phone within reach Nurse Communication: Mobility status;Precautions  GO Functional Assessment Tool Used: clinical judgement Functional Limitation: Self care Self Care Current Status (W0981): At least 60 percent but less than 80 percent impaired, limited or restricted Self Care Goal Status (X9147): At least 60 percent but less than 80 percent impaired, limited or restricted   Harolyn Rutherford 05/23/2013, 3:28 PM Pager: [phone]

## 2013-05-23 NOTE — Care Management Note (Signed)
  Page 2 of 2   05/23/2013     2:04:37 PM   CARE MANAGEMENT NOTE 05/23/2013  Patient:  Charlene Watts, Charlene Watts   Account Number:  [id number]  Date Initiated:  05/23/2013  Documentation initiated by:  Ronny Flurry  Subjective/Objective Assessment:     Action/Plan:   Anticipated DC Date:  05/24/2013   Anticipated DC Plan:  HOME W HOME HEALTH SERVICES         Choice offered to / List presented to:  C-1 Patient   DME arranged  WHEELCHAIR - MANUAL      DME agency  [facility]     HH arranged  HH-2 PT  HH-3 OT      [facility] agency  [facility]   Status of service:   Medicare Important Message given?   (If response is "NO", the following Medicare IM given date fields will be blank) Date Medicare IM given:   Date Additional Medicare IM given:    Discharge Disposition:    Per UR Regulation:    If discussed at Long Length of Stay Meetings, dates discussed:    Comments:  05-23-13 Confirmed face sheet information with patient and family . PT recommended wheelachair , family thinks they may have a wheelchair at home , will check .  Will follow up with patinet this afternoon.  Patient does NOT want 3 in 1  Ronny Flurry RN BSN [phone]

## 2013-05-23 NOTE — Anesthesia Postprocedure Evaluation (Signed)
  Anesthesia Post-op Note  Patient: Charlene Watts  Procedure(s) Performed: Procedure(s): IRRIGATION AND DEBRIDEMENT EXTREMITY (Left) OPEN REDUCTION INTERNAL FIXATION (ORIF) ANKLE FRACTURE (Left)  Patient Location: PACU  Anesthesia Type:General  Level of Consciousness: awake, oriented, sedated and patient cooperative  Airway and Oxygen Therapy: Patient Spontanous Breathing  Post-op Pain: mild  Post-op Assessment: Post-op Vital signs reviewed, Patient's Cardiovascular Status Stable, Respiratory Function Stable, Patent Airway, No signs of Nausea or vomiting and Pain level controlled  Post-op Vital Signs: stable  Complications: No apparent anesthesia complications

## 2013-05-23 NOTE — Evaluation (Signed)
Physical Therapy Evaluation Patient Details Name: Charlene Watts MRN: [id number] DOB: 24-Aug-1945 Today's Date: 05/23/2013 Time: 4098-1191 PT Time Calculation (min): 37 min  PT Assessment / Plan / Recommendation History of Present Illness    She [name] about 4 feet off of a ladder suffering an injury to her left foot. She was brought in by EMS with obvious open fracture to her left ankle. She denies other injury. She received morphine and root but still rates pain at 4/10. It feels better she keeps her foot still and is worse with any movement. She denies other injury. She denies loss of consciousness and denies head, neck, back, chest, abdomen injury. She does not on her last tetanus immunization was.  Pt is now s/p I& D and ORIF left ankle.   Clinical Impression  Patient demonstrates deficits in functional mobility as indicated below. Patient with poor safety during mobility and demonstrates increased risk of fall at this time. Attempted stair negotiation with patient, patient unable to perform despite increased assist. Patient also demonstrates poor activity tolerance and become nauseated with minimal activity. At this time do not feel patient is safe for discharge, anticipate patient will need further therapy instruction and use of a w/c for mobility upon discharge. Additionally, patient will need to line up assist as patient states husband is not able to provide assist for her. Will continue to see as indicated and progress as tolerated to facilitate safe d/c home.     PT Assessment  Patient needs continued PT services    Follow Up Recommendations  Home health PT;Supervision/Assistance - 24 hour       Barriers to Discharge Inaccessible home environment;Decreased caregiver support steps to enter the home and husband unable to assist    Equipment Recommendations  Wheelchair (measurements PT)    Recommendations for Other Services     Frequency Min 4X/week    Precautions / Restrictions  Precautions Precautions: Fall Restrictions Weight Bearing Restrictions: Yes LLE Weight Bearing: Non weight bearing   Pertinent Vitals/Pain 5/10, nauseated with mobility      Mobility  Bed Mobility Bed Mobility: Supine to Sit;Sitting - Scoot to Edge of Bed Supine to Sit: 4: Min guard Sitting - Scoot to [place] of Bed: 5: Supervision Details for Bed Mobility Assistance: increased time to perform, Cues for LLE movement  Transfers Transfers: Sit to Stand;Stand to Sit;Stand Pivot Transfers Sit to Stand: 4: Min assist Stand to Sit: 4: Min assist Stand Pivot Transfers: 4: Min assist Details for Transfer Assistance: Max VCs for hand placement and safety with no carryover demonstrated.  Min assist for stability secondary to posterior lean and poor positioing of LLE to maintain NWBing Ambulation/Gait Ambulation/Gait Assistance: 4: Min assist;3: Mod assist Ambulation Distance (Feet): 12 Feet Assistive device: Rolling walker Ambulation/Gait Assistance Details: Min to mod assist for stability to prevent Loss of balance as patient attempting to ambulate with posterior lean, poor positioning with RW, and impulsivity.   Gait Pattern: Step-to pattern Gait velocity: decreased        PT Diagnosis: Difficulty walking;Abnormality of gait;Acute pain  PT Problem List: Decreased strength;Decreased range of motion;Decreased activity tolerance;Decreased balance;Decreased mobility;Decreased knowledge of use of DME;Pain PT Treatment Interventions: DME instruction;Gait training;Stair training;Functional mobility training;Therapeutic activities;Therapeutic exercise;Balance training;Patient/family education     PT Goals(Current goals can be found in the care plan section) Acute Rehab PT Goals Patient Stated Goal: to go home and manage PT Goal Formulation: With patient Time For Goal Achievement: 05/30/13 Potential to Achieve Goals: Fair  Visit  Information  Last PT Received On: 05/23/13 Assistance Needed:  +1       Prior Functioning  Home Living Family/patient expects to be discharged to:: Private residence Living Arrangements: Spouse/significant other Available Help at Discharge: Family Type of Home: House Home Access: Stairs to enter Secretary/administrator of Steps: 3 Home Layout: One level Home Equipment: Environmental consultant - 2 wheels;Shower seat;Cane - single point Prior Function Level of Independence: Independent Communication Communication: No difficulties    Cognition  Cognition Arousal/Alertness: Awake/alert Behavior During Therapy: Flat affect;Impulsive Overall Cognitive Status: Impaired/Different from baseline Area of Impairment: Memory;Safety/judgement;Problem solving Memory: Decreased short-term memory Safety/Judgement: Decreased awareness of safety;Decreased awareness of deficits Problem Solving: Slow processing;Requires verbal cues;Requires tactile cues General Comments: Patient VC'd several times for hand plccement with no carryover, kept stating that she could manage and do things at home but despite inability to perform simple tasks here with therapies    Extremity/Trunk Assessment Upper Extremity Assessment Upper Extremity Assessment: Defer to OT evaluation Lower Extremity Assessment Lower Extremity Assessment: LLE deficits/detail LLE: Unable to fully assess due to pain;Unable to fully assess due to immobilization   Balance High Level Balance High Level Balance Activites: Side stepping;Turns High Level Balance Comments: poor stability as pt attempting to move to quickly with poor positioning within RW  End of Session PT - End of Session Equipment Utilized During Treatment: Gait belt Activity Tolerance: Patient limited by fatigue;Patient limited by pain;Other (comment) (nauseated) Patient left: in chair;with call bell/phone within reach Nurse Communication: Mobility status;Weight bearing status  GP Functional Assessment Tool Used: clinical judgement Functional  Limitation: Mobility: Walking and moving around Mobility: Walking and Moving Around Current Status (Z6109): 0 percent impaired, limited or restricted Mobility: Walking and Moving Around Goal Status (U0454): At least 40 percent but less than 60 percent impaired, limited or restricted   Fabio Asa 05/23/2013, 11:40 AM Charlotte Crumb, PT DPT  [phone]

## 2013-05-23 NOTE — Op Note (Signed)
05/22/2013 - 05/23/2013  2:15 AM  PATIENT:  Charlene Watts    PRE-OPERATIVE DIAGNOSIS:  Open Left ankle fracture  POST-OPERATIVE DIAGNOSIS:  Same  PROCEDURE:  IRRIGATION AND DEBRIDEMENT EXTREMITY, OPEN REDUCTION INTERNAL FIXATION (ORIF) ANKLE FRACTURE  SURGEON:  Agape Hardiman, D, MD  ASSISTANT: none  ANESTHESIA:   gen  PREOPERATIVE INDICATIONS:  Charlene Watts is a  67 y.o. female with a diagnosis of Open Left ankle fracture who failed conservative measures and elected for surgical management.    The risks benefits and alternatives were discussed with the patient preoperatively including but not limited to the risks of infection, bleeding, nerve injury, cardiopulmonary complications, the need for revision surgery, among others, and the patient was willing to proceed.  OPERATIVE IMPLANTS: titanium canulated screws and lateral fibular plate  OPERATIVE FINDINGS: Unstable ankle fracture. Stable syndesmosis post op  BLOOD LOSS: 50  COMPLICATIONS: none  TOURNIQUET TIME:  OPERATIVE PROCEDURE:  Patient was identified in the preoperative holding area and site was marked by me He was transported to the operating theater and placed on the table in supine position taking care to pad all bony prominences. After a preincinduction time out anesthesia was induced. The left lower extremity was prepped and draped in normal sterile fashion and a pre-incision timeout was performed. Charlene Watts received ancef for preoperative antibiotics.   A reduction of the fracture. I examined the tibial articular surface in the shoulder was missing small amount cartilage. The visible talar dome was without harm. I then performed a thorough irrigation of the open wound with a liter of saline. There is no gross contamination. Posterior tib was found to be intact as was her dorsalis pedis pulse. I then performed a reduction maneuver and placed 2 cannulated screws orthogonally across the fracture site. I elected to  place the anterior screw long enough to get a cortical bite and used a washer for better compression. As happy with the reduction here I then closed this wound after another irrigation with a nylon suture leaving somewhat loose and able to drain if needed.  I then turned my attention laterally  I made a lateral incision of roughly 7 cm dissection was carried down sharply to the distal fibula and then spreading dissection was used proximally to protect the superficial peroneal nerve. I sharply incised the periosteum and took care to protect the peroneal tendons. I then debrided the fracture site there was a large amount of comminution. Overall the length and alignment was maintained side elected to bridge this fracture.   I then selected a 7-hole plate and placed in a bridge fashion care was taken distally so as not to penetrate the joint with the locking screws.  I then stressed the syndesmosis and found that it was unstable for syndesmotic fixation I performed a reduction maneuver and placed a 3.5 screw through the plate.  The wound was then thoroughly irrigated and closed using a 0 Vicryl and absorbable Monocryl sutures. Her was placed in a short leg splint.   POST OPERATIVE PLAN: Non-weightbearing. DVT prophylaxis will consist of SCD's and [id number]

## 2013-05-24 LAB — BASIC METABOLIC PANEL
CO2: 31 mEq/L (ref 19–32)
Calcium: 8.2 mg/dL — ABNORMAL LOW (ref 8.4–10.5)
Creatinine, Ser: 1.07 mg/dL (ref 0.50–1.10)
GFR calc Af Amer: 62 mL/min — ABNORMAL LOW (ref >90)
Glucose, Bld: 107 mg/dL — ABNORMAL HIGH (ref 70–99)
Potassium: 3.3 mEq/L — ABNORMAL LOW (ref 3.5–5.1)

## 2013-05-24 LAB — CBC
Hemoglobin: 9.4 g/dL — ABNORMAL LOW (ref 12.0–15.0)
MCH: 29.5 pg (ref 26.0–34.0)
MCV: 91.5 fL (ref 78.0–100.0)
Platelets: 161 10*3/uL (ref 150–400)
RDW: 15.4 % (ref 11.5–15.5)

## 2013-05-24 NOTE — Progress Notes (Signed)
PT Addedum After discussion with OT regarding home situation/assistance, it appears patient will need SNF placement.   Plan of care updated.  05/24/13 1526  PT - Assessment/Plan  PT Plan Discharge plan needs to be updated  Follow Up Recommendations SNF   05/24/2013 Corlis Hove, PT [phone]

## 2013-05-24 NOTE — Progress Notes (Signed)
Per patient, daughter will be picking her up from the hospital and taking her home. Patient also stated daughter will be helping her at home as well. There are some inconsistencies with patients description of assistance available but patient verbally stated that she understood that she needed assistance with mobility.    Seen and agreed 05/24/2013 Fredrich Birks PTA [phone] pager [phone] office

## 2013-05-24 NOTE — Progress Notes (Signed)
Physical Therapy Treatment Patient Details Name: Charlene Watts MRN: [id number] DOB: Oct 30, 1945 Today's Date: 05/24/2013 Time: 1308-6578 PT Time Calculation (min): 31 min  PT Assessment / Plan / Recommendation  History of Present Illness [name] about 4 feet off of a ladder suffering an injury to her left foot. She was brought in by EMS with obvious open fracture to her left ankle. She denies other injury. She received morphine and root but still rates pain at 4/10. It feels better she keeps her foot still and is worse with any movement. She denies other injury. She denies loss of consciousness and denies head, neck, back, chest, abdomen injury. She does not on her last tetanus immunization was.  Pt is now s/p I& D and ORIF left ankle.   PT Comments   Pt remains very impulsive with all mobility; pt required max vc's throughout tx and educated throughout on importance of slowing down and cues for safety as pt has poor safety awareness. Pt educated on importance of having family min guard when ambulating/transferring.  Pt stated that they have a ramp to enter and that her family can push her inside instead of utilizing steps.  Pt stated that she will have 24 hour assistance at home from family.  Pt will greatly benefit from HHPT to increase functional independence, safety awareness, and mobility at home.    Follow Up Recommendations  Home health PT;Supervision/Assistance - 24 hour     Does the patient have the potential to tolerate intense rehabilitation     Barriers to Discharge        Equipment Recommendations       Recommendations for Other Services    Frequency Min 4X/week   Progress towards PT Goals Progress towards PT goals: Progressing toward goals  Plan Current plan remains appropriate    Precautions / Restrictions Precautions Precautions: Fall Restrictions Weight Bearing Restrictions: Yes LLE Weight Bearing: Non weight bearing   Pertinent Vitals/Pain 6/10 LLE pain; pt  repositioned post tx for comfort and ice pack applied     Mobility  Bed Mobility Bed Mobility: Supine to Sit;Sitting - Scoot to [place] of Bed;Sit to Supine;Scooting to [facility] Supine to Sit: 5: Supervision Sitting - Scoot to Edge of Bed: 5: Supervision Sit to Supine: 5: Supervision Scooting to [facility]: 5: Supervision Details for Bed Mobility Assistance: pt did well with LLE managment  Transfers Transfers: Sit to Stand;Stand to Sit;Stand Pivot Transfers Sit to Stand: 4: Min guard Stand to Sit: 4: Min guard Stand Pivot Transfers: 4: Min assist Details for Transfer Assistance: cues throughout for safe technique and hand placement and min assist for SPT for safe technique Ambulation/Gait Ambulation/Gait Assistance: 4: Min assist Ambulation Distance (Feet): 15 Feet Assistive device: Rolling walker Ambulation/Gait Assistance Details: mini LOBx2 requiring assistance to regain balance; pt educated throughout tx on importance of having family min guard when ambulating or transfering for safety to prevent falls.  Pt very unsteady and impulsive despite verbal cues.  Pt educated on slowing down throughout. Gait Pattern: Step-to pattern Corporate treasurer: Yes Wheelchair Assistance: 4: Administrator, sports Details (indicate cue type and reason): vc's for technique with forwards/backwards/turning, with w/c negotiation through tight spaces. Educated on parts of Heritage manager: Both upper extremities Wheelchair Parts Management: Needs assistance Distance: 400    Exercises     PT Diagnosis:    PT Problem List:   PT Treatment Interventions:     PT Goals (current goals can now be found in the  care plan section)    Visit Information  Last PT Received On: 05/24/13 Assistance Needed: +1 History of Present Illness: [name] about 4 feet off of a ladder suffering an injury to her left foot. She was brought in by EMS with obvious open fracture to her left ankle.  She denies other injury. She received morphine and root but still rates pain at 4/10. It feels better she keeps her foot still and is worse with any movement. She denies other injury. She denies loss of consciousness and denies head, neck, back, chest, abdomen injury. She does not on her last tetanus immunization was.  Pt is now s/p I& D and ORIF left ankle.    Subjective Data      Cognition  Cognition Arousal/Alertness: Awake/alert Behavior During Therapy: Flat affect;Impulsive Overall Cognitive Status: Impaired/Different from baseline Area of Impairment: Safety/judgement Safety/Judgement: Decreased awareness of safety;Decreased awareness of deficits Problem Solving: Slow processing;Requires verbal cues;Requires tactile cues General Comments: vc's for hand placement wit both sit<>stands; pt very impulsive and initiates mobility prior to equipment     Balance     End of Session PT - End of Session Equipment Utilized During Treatment: Gait belt Activity Tolerance: Patient tolerated treatment well Patient left: in bed;with call bell/phone within reach Nurse Communication: Mobility status   GP     Ernestina Columbia, SPTA 05/24/2013, 12:03 PM

## 2013-05-24 NOTE — Progress Notes (Signed)
    Subjective:  Patient reports pain as mild Denies any traum to the neck or N/T  Objective:   VITALS:   Filed Vitals:   05/23/13 1335 05/23/13 1753 05/23/13 2229 05/24/13 0631  BP: 116/52 105/44 105/52 96/42  Pulse: 103 96 96 96  Temp: 98.3 F (36.8 C) 98.2 F (36.8 C) 98.1 F (36.7 C) 98.2 F (36.8 C)  TempSrc: Oral Oral Oral Oral  Resp: 18 18 20 20   Height:      Weight:      SpO2: 100% 97% 92% 93%    Physical Exam  Dressing: C/D/I  Compartments soft  SILT DP/SP/S/S/T, 2+DP, +TA/GS/EHL No TTP at spine, painless ROM c-spine LABS  No results found for this or any previous visit (from the past 24 hour(s)).   Assessment/Plan: 2 Days Post-Op   Active Problems:   * No active hospital problems. *   PLAN: She did not clear PT yesterday and was unable to d/c home, she feels as though she can clear today so will set up for d/c today I removed her C-collar given the lack of h/o truama to the c-spine and painless FROM with no TTP Weight Bearing: NWB Dressings: keep intact VTE prophylaxis: ASA and scd's Dispo: likely home today   Charlene Watts, D 05/24/2013, 7:46 AM   Charlene Rana, MD Cell [phone]

## 2013-05-24 NOTE — Progress Notes (Signed)
Occupational Therapy Treatment Patient Details Name: Charlene Watts MRN: [id number] DOB: [birth year] Today's Date: 05/24/2013 Time: 6962-9528 OT Time Calculation (min): 44 min  OT Assessment / Plan / Recommendation  History of present illness She [name] about 4 feet off of a ladder suffering an injury to her left foot. She was brought in by EMS with obvious open fracture to her left ankle. She denies other injury. She received morphine and root but still rates pain at 4/10. It feels better she keeps her foot still and is worse with any movement. She denies other injury. She denies loss of consciousness and denies head, neck, back, chest, abdomen injury. She does not on her last tetanus immunization was.  Pt is now s/p I& D and ORIF left ankle.   OT comments  Family ( daughter and grandson present) for family education. Daughter told patient she needs to practice more before returning home. Uncertain if family able to assist at this time with d/c home. Family and patient not giving therapist a definite answer to wishes. RN Okey Regal informed family present and practicing.   Follow Up Recommendations  SNF;Supervision/Assistance - 24 hour    Barriers to Discharge       Equipment Recommendations  3 in 1 bedside comode;Wheelchair (measurements OT);Wheelchair cushion (measurements OT)    Recommendations for Other Services    Frequency Min 2X/week   Progress towards OT Goals Progress towards OT goals: Progressing toward goals  Plan Discharge plan needs to be updated    Precautions / Restrictions Precautions Precautions: Fall Restrictions LLE Weight Bearing: Non weight bearing   Pertinent Vitals/Pain None reported    ADL  Toilet Transfer: Moderate assistance Toilet Transfer Method: Stand pivot Toilet Transfer Equipment: Regular height toilet Toileting - Clothing Manipulation and Hygiene: Min guard Where Assessed - Toileting Clothing Manipulation and Hygiene: Sit on 3-in-1 or toilet Equipment  Used: Gait belt;Rolling walker;Wheelchair Transfers/Ambulation Related to ADLs: Pt supine in bed on arrival. Therapist educating patient on how to set up environment with W/c and RW to transfer. Pt completes sit<> stand. Pt unable to pivot with RW and LOB occurs with max (A) to correct it. pt impusive and attempting again quickly with another LOB> pt unable to manage RW and elevated leg rest of w/c and again LOB occurrs max (A). OT in a position to safely allow this LOB prior to occurring. Pt with LoB so great that RW was completely off the floor on the left side. Pt would have fallen 100% if completing this transfer at home alone. Pt once in w/c decides to complete toilet tranfer. Pt unable to verbalize to therapist is w/c will fit in bathroom at home. Pt requesting to use w/c so transfer attempted. pt previously reports grab bar in bathroom but with further in depth questioning is discovered to have grab bar in a location that is not in a position that can be used for w/c transfer. pt decides to complete transfer and needs Max (A) due to lob. PT needed max cues for w/c safety and how to swing away leg rest. Pt unable to navigate in w/c and needed (A) throughout session to use w/c safely. Pt when challenged continued to repeat the action that was unsuccessful without change. This continuous attempt at unsuccessful action demonstrates POOR PROBLEM solving. Pt exiting bathroom and attempting bed transfer. This time OT educated patient on swing away arm rest to allow a sliding transfer to attempt to downgrade transfer further.  So at this point in time patient  has attempted use of RW to w/c unsuccessful, stand pivot to toilet unsuccessful and previous session RW use to toilet unsuccessful. the lowest level of transfer is a sliding pivot transfer from w/c. pt falls into bed surface at an angle in the bed. Pt asked how this session went? OT educated patient on unsafe transfers and discussed in depth the falls that  would have occurred at home. The phone rings and it is patients daughter. FRom the conversation with daughter and patient it is still uncertain if 41 yo granddaughter will be assisting. OT is changing d/c recommendations to SNF DUE TO UNCERTAIN d/c with extremely high fall risk. Pt will fall if dcing home. Pt must have 24/ 7 assistance for all transfers. Family does not have family support for this level. PT educated on need for SNF. Pt expressed understanding safety concerns. Pt educated that family must be present for education and if a safe d/c can be practiced with therapy recommendations could be changed back to hhot.  ADL Comments: Pt practicing all transfers with family present. pt with poor problem solving and unable to verbalize to family how to safely assist with transfers. Pt with unsafe use of w/c and not locking brakes. Pt remains high fall risk. Daughter concerned with no family to assist after Sunday evening. pt must be MOD I at home starting monday. Family to further discuss home d/c thoughts.     OT Diagnosis:    OT Problem List:   OT Treatment Interventions:     OT Goals(current goals can now be found in the care plan section) Acute Rehab OT Goals Patient Stated Goal: to go home and manage OT Goal Formulation: With patient Time For Goal Achievement: 06/06/13 Potential to Achieve Goals: Good ADL Goals Pt Will Perform Grooming: with modified independence;standing Pt Will Perform Upper Body Bathing: with modified independence;standing Pt Will Perform Lower Body Bathing: with modified independence;sit to/from stand Pt Will Perform Upper Body Dressing: with modified independence;standing Pt Will Perform Lower Body Dressing: with modified independence;sit to/from stand Pt Will Transfer to Toilet: with modified independence;bedside commode Additional ADL Goal #1: Pt will complete bed mobility MOD i without HOB UP or rails  Visit Information  Last OT Received On: 06/23/13 Assistance  Needed: +1 History of Present Illness: She [name] about 4 feet off of a ladder suffering an injury to her left foot. She was brought in by EMS with obvious open fracture to her left ankle. She denies other injury. She received morphine and root but still rates pain at 4/10. It feels better she keeps her foot still and is worse with any movement. She denies other injury. She denies loss of consciousness and denies head, neck, back, chest, abdomen injury. She does not on her last tetanus immunization was.  Pt is now s/p I& D and ORIF left ankle.    Subjective Data      Prior Functioning       Cognition  Cognition Arousal/Alertness: Awake/alert Behavior During Therapy: Flat affect;Impulsive Overall Cognitive Status: Impaired/Different from baseline Area of Impairment: Memory;Following commands;Safety/judgement;Problem solving;Awareness Memory: Decreased short-term memory;Decreased recall of precautions Following Commands: Follows one step commands with increased time Safety/Judgement: Decreased awareness of deficits;Decreased awareness of safety Awareness: Anticipatory Problem Solving: Slow processing;Difficulty sequencing    Mobility  Bed Mobility Bed Mobility: Supine to Sit;Sitting - Scoot to Edge of Bed;Sit to Supine Supine to Sit: 4: Min guard Sitting - Scoot to Edge of Bed: 4: Min guard Sit to Supine: 4: Min guard;HOB  flat Details for Bed Mobility Assistance: unsafe demo and needing (A) to adjust alignment in the bed Transfers Transfers: Sit to Stand;Stand to Sit Sit to Stand: 4: Min guard;With upper extremity assist;From bed Stand to Sit: 3: Mod assist;With upper extremity assist;To bed Details for Transfer Assistance: needing cues for safety and uncontrolled d/c to bed surface    Exercises      Balance     End of Session OT - End of Session Activity Tolerance: Patient tolerated treatment well Patient left: in bed;with call bell/phone within reach Nurse Communication:  Mobility status;Precautions  GO     Harolyn Rutherford 05/24/2013, 4:05 PM Pager: [phone]

## 2013-05-24 NOTE — Progress Notes (Signed)
Occupational Therapy Treatment Patient Details Name: Charlene Watts MRN: [id number] DOB: February 16, 1946 Today's Date: 05/24/2013 Time: 4098-1191 OT Time Calculation (min): 32 min  OT Assessment / Plan / Recommendation  History of present illness She [name] about 4 feet off of a ladder suffering an injury to her left foot. She was brought in by EMS with obvious open fracture to her left ankle. She denies other injury. She received morphine and root but still rates pain at 4/10. It feels better she keeps her foot still and is worse with any movement. She denies other injury. She denies loss of consciousness and denies head, neck, back, chest, abdomen injury. She does not on her last tetanus immunization was.  Pt is now s/p I& D and ORIF left ankle.   OT comments  Pt with 6 LOB during session and required greater than Min (A) to correct. Pt was unable to self correct LOB during session and would have resulted in a fall. Pt is NOT SAFE TO D/C HOME at this time. Pt unreliable historian and unable to clearly verify d/c plans. Pt has reported at eval and now that granddaughter would be assisting at d/c however during session phone call with daughter revealed this is not a definite plan. Pt WILL FALL if going home without (A). NO family has been present for therapy to educate. Pt and RN Okey Regal informed that family must be educated by therapist and safely demonstrate transfers to d/c home. RECOMMEND SNF due to poor safety awareness and no family support   Follow Up Recommendations  SNF;Supervision/Assistance - 24 hour    Barriers to Discharge       Equipment Recommendations  3 in 1 bedside comode;Wheelchair (measurements OT);Wheelchair cushion (measurements OT)    Recommendations for Other Services    Frequency Min 2X/week   Progress towards OT Goals Progress towards OT goals: Progressing toward goals  Plan Discharge plan needs to be updated    Precautions / Restrictions Precautions Precautions:  Fall Restrictions Weight Bearing Restrictions: Yes LLE Weight Bearing: Non weight bearing   Pertinent Vitals/Pain None reported    ADL  Toilet Transfer: Moderate assistance Toilet Transfer Method: Stand pivot Toilet Transfer Equipment: Regular height toilet Toileting - Clothing Manipulation and Hygiene: Min guard Where Assessed - Toileting Clothing Manipulation and Hygiene: Sit on 3-in-1 or toilet Equipment Used: Gait belt;Rolling walker;Wheelchair Transfers/Ambulation Related to ADLs: Pt supine in bed on arrival. Therapist educating patient on how to set up environment with W/c and RW to transfer. Pt completes sit<> stand. Pt unable to pivot with RW and LOB occurs with max (A) to correct it. pt impusive and attempting again quickly with another LOB> pt unable to manage RW and elevated leg rest of w/c and again LOB occurrs max (A). OT in a position to safely allow this LOB prior to occurring. Pt with LoB so great that RW was completely off the floor on the left side. Pt would have fallen 100% if completing this transfer at home alone. Pt once in w/c decides to complete toilet tranfer. Pt unable to verbalize to therapist is w/c will fit in bathroom at home. Pt requesting to use w/c so transfer attempted. pt previously reports grab bar in bathroom but with further in depth questioning is discovered to have grab bar in a location that is not in a position that can be used for w/c transfer. pt decides to complete transfer and needs Max (A) due to lob. PT needed max cues for w/c safety and how  to swing away leg rest. Pt unable to navigate in w/c and needed (A) throughout session to use w/c safely. Pt when challenged continued to repeat the action that was unsuccessful without change. This continuous attempt at unsuccessful action demonstrates POOR PROBLEM solving. Pt exiting bathroom and attempting bed transfer. This time OT educated patient on swing away arm rest to allow a sliding transfer to attempt to  downgrade transfer further.  So at this point in time patient has attempted use of RW to w/c unsuccessful, stand pivot to toilet unsuccessful and previous session RW use to toilet unsuccessful. the lowest level of transfer is a sliding pivot transfer from w/c. pt falls into bed surface at an angle in the bed. Pt asked how this session went? OT educated patient on unsafe transfers and discussed in depth the falls that would have occurred at home. The phone rings and it is patients daughter. FRom the conversation with daughter and patient it is still uncertain if 65 yo granddaughter will be assisting. OT is changing d/c recommendations to SNF DUE TO UNCERTAIN d/c with extremely high fall risk. Pt will fall if dcing home. Pt must have 24/ 7 assistance for all transfers. Family does not have family support for this level. PT educated on need for SNF. Pt expressed understanding safety concerns. Pt educated that family must be present for education and if a safe d/c can be practiced with therapy recommendations could be changed back to hhot.  ADL Comments: Pt unable to give any information even after phone call with daughter about if family can come to hosptial for education. The inabiility to have family present for education further reinforces therapist recommendation for SNF. Pt with cognitive deficits present.    OT Diagnosis:    OT Problem List:   OT Treatment Interventions:     OT Goals(current goals can now be found in the care plan section) Acute Rehab OT Goals Patient Stated Goal: to go home and manage OT Goal Formulation: With patient Time For Goal Achievement: 06/06/13 Potential to Achieve Goals: Good ADL Goals Pt Will Perform Grooming: with modified independence;standing Pt Will Perform Upper Body Bathing: with modified independence;standing Pt Will Perform Lower Body Bathing: with modified independence;sit to/from stand Pt Will Perform Upper Body Dressing: with modified  independence;standing Pt Will Perform Lower Body Dressing: with modified independence;sit to/from stand Pt Will Transfer to Toilet: with modified independence;bedside commode Additional ADL Goal #1: Pt will complete bed mobility MOD i without HOB UP or rails  Visit Information  Last OT Received On: 06/23/13 Assistance Needed: +1 History of Present Illness: She [name] about 4 feet off of a ladder suffering an injury to her left foot. She was brought in by EMS with obvious open fracture to her left ankle. She denies other injury. She received morphine and root but still rates pain at 4/10. It feels better she keeps her foot still and is worse with any movement. She denies other injury. She denies loss of consciousness and denies head, neck, back, chest, abdomen injury. She does not on her last tetanus immunization was.  Pt is now s/p I& D and ORIF left ankle.    Subjective Data      Prior Functioning       Cognition  Cognition Arousal/Alertness: Awake/alert Behavior During Therapy: Flat affect;Impulsive Overall Cognitive Status: Impaired/Different from baseline Area of Impairment: Memory;Following commands;Safety/judgement;Problem solving;Awareness Memory: Decreased short-term memory;Decreased recall of precautions Following Commands: Follows one step commands with increased time Safety/Judgement: Decreased awareness of  deficits;Decreased awareness of safety Awareness: Anticipatory Problem Solving: Slow processing;Difficulty sequencing General Comments: vc's for hand placement wit both sit<>stands; pt very impulsive and initiates mobility prior to equipment     Mobility  Bed Mobility Bed Mobility: Supine to Sit;Sitting - Scoot to [place] of Bed;Sit to Supine Supine to Sit: 4: Min guard Sitting - Scoot to [place] of Bed: 4: Min guard Sit to Supine: 4: Min guard;HOB flat Scooting to [facility]: 5: Supervision Details for Bed Mobility Assistance: unsafe demo and needing (A) to adjust alignment in the  bed Transfers Transfers: Sit to Stand;Stand to Sit Sit to Stand: 4: Min guard;With upper extremity assist;From bed Stand to Sit: 3: Mod assist;With upper extremity assist;To bed Details for Transfer Assistance: needing cues for safety and uncontrolled d/c to bed surface    Exercises      Balance     End of Session OT - End of Session Activity Tolerance: Patient tolerated treatment well Patient left: in bed;with call bell/phone within reach Nurse Communication: Mobility status;Precautions  GO     Harolyn Rutherford 05/24/2013, 3:04 PM Pager: [phone]

## 2013-05-25 DIAGNOSIS — S82892B Other fracture of left lower leg, initial encounter for open fracture type I or II: Secondary | ICD-10-CM

## 2013-05-25 MED ORDER — POTASSIUM CHLORIDE CRYS ER 20 MEQ PO TBCR
20.0000 meq | EXTENDED_RELEASE_TABLET | Freq: Two times a day (BID) | ORAL | Status: DC
  Administered 2013-05-25 – 2013-05-30 (×11): 20 meq via ORAL
  Filled 2013-05-25 (×14): qty 1

## 2013-05-25 MED ORDER — ASPIRIN 325 MG PO TBEC: 325.0000 mg | DELAYED_RELEASE_TABLET | Freq: Every day | ORAL | Status: AC

## 2013-05-25 NOTE — Progress Notes (Signed)
Occupational Therapy Treatment Patient Details Name: Charlene Watts MRN: [id number] DOB: [birth year] Today's Date: 05/25/2013 Time: 4098-1191 OT Time Calculation (min): 19 min  OT Assessment / Plan / Recommendation  History of present illness She [name] about 4 feet off of a ladder suffering an injury to her left foot. She was brought in by EMS with obvious open fracture to her left ankle. She denies other injury. She received morphine and root but still rates pain at 4/10. It feels better she keeps her foot still and is worse with any movement. She denies other injury. She denies loss of consciousness and denies head, neck, back, chest, abdomen injury. She does not on her last tetanus immunization was.  Pt is now s/p I& D and ORIF left ankle.   OT comments  [name] admitted with above and now NWB'ing LLe presents to acute OT with impulsive behavior and decreased safety awareness/rememberance of how to place hands to safely transfer. [name] this AM due to feeling like she could get back from the bathroom by herself and did not call for A (prior OT notes state she is not safe to get to/from places with RW safely). Will benefit from SNF level of therapy before returning home with only husband to provide S (she normally takes care of him).  Follow Up Recommendations  SNF;Supervision/Assistance - 24 hour       Equipment Recommendations  3 in 1 bedside comode;Wheelchair (measurements OT);Wheelchair cushion (measurements OT)       Frequency Min 2X/week   Progress towards OT Goals Progress towards OT goals: Progressing toward goals  Plan Discharge plan remains appropriate    Precautions / Restrictions Precautions Precautions: Fall Precaution Comments: [name] trying to get back from the bathroom by herself earlier today  Restrictions Weight Bearing Restrictions: Yes LLE Weight Bearing: Non weight bearing   Pertinent Vitals/Pain 2/10 LLE (ankle and hip); repositioned and RN made aware     ADL  Toilet Transfer: Min Pension scheme manager Method: Ambulance person: Bedside commode (multiple cues for safe and correct hand placement) Toileting - Clothing Manipulation and Hygiene: Min guard Where Assessed - Toileting Clothing Manipulation and Hygiene:  (partial sit stand alternating arms to adjust clothing) Transfers/Ambulation Related to ADLs: Pt supine upon arrival with 3n1 beside her bed (HOB). Since pt is having such a hard time standing and transferring with RW felt we should try stand/squat pivot from bed<>3n1 (pt min guard A)--a bit impulsive. I had her do this 3 times back and forth with me and having her adjust her gown adjusting clothes. Then I had her practice transferring the same way bed<>W/C, locking and unlocking brakes, moving her W/C out of the way so she could then swing to get her legs back in the bed--she did all of this with min guard A and Mod cues for safety. I also had her practice standing at the sink as if she were having to wash her hair there----the safest way we found was for her to wheel up to the sink, lock brakes, pull up on sink, prop left knee on chair while standing on RLE and propping with Bil UEs on counter top--then someone would then have to help her wash her hair with her job just working on standing. Still feel that pt would benefit from SNF stay to get safer and more independent than she is, I just her know that what we practiced is safer than trying to use the RW--her comment was, "yes  it is, especially since I felll earlier today trying to get back from the bathroom by myself"--no mention in chart so I asked RN and she said the pt did fall she just had not had the time to write it up yet.     OT Goals(current goals can now be found in the care plan section)    Visit Information  Last OT Received On: 05/25/13 Assistance Needed: +1 History of Present Illness: She [name] about 4 feet off of a ladder suffering an injury to her  left foot. She was brought in by EMS with obvious open fracture to her left ankle. She denies other injury. She received morphine and root but still rates pain at 4/10. It feels better she keeps her foot still and is worse with any movement. She denies other injury. She denies loss of consciousness and denies head, neck, back, chest, abdomen injury. She does not on her last tetanus immunization was.  Pt is now s/p I& D and ORIF left ankle.          Cognition  Cognition Arousal/Alertness: Awake/alert Behavior During Therapy: Flat affect;Impulsive Overall Cognitive Status: Impaired/Different from baseline Area of Impairment: Safety/judgement;Memory;Problem solving Memory: Decreased short-term memory (for how to place hands for safe stand/squat pivot transfers without RW) Safety/Judgement: Decreased awareness of safety (Even when asked to do transfers slower so it is safer she does not seem to be able to do this) Problem Solving: Difficulty sequencing;Requires verbal cues    Mobility  Bed Mobility Bed Mobility: Supine to Sit;Sit to Supine;Sitting - Scoot to Edge of Bed Supine to Sit: 5: Supervision;HOB elevated Sitting - Scoot to Edge of Bed: 5: Supervision Sit to Supine: 5: Supervision;With rail;HOB elevated          End of Session OT - End of Session Equipment Utilized During Treatment:  (W/C, 3n1) Activity Tolerance: Patient tolerated treatment well Patient left: in bed;with call bell/phone within reach Nurse Communication:  (questioned about pt reporting fall earlier and no mention in chart)       Evette Georges [phone] 05/25/2013, 1:45 PM

## 2013-05-25 NOTE — Progress Notes (Addendum)
     Subjective:  Patient reports pain as mild.  Unable to ambulate with PT, NWB, husband is disabled, no help at home.  Denies neck pain.   Objective:   VITALS:   Filed Vitals:   05/24/13 0631 05/24/13 1440 05/24/13 2215 05/25/13 0531  BP: 96/42 105/52 103/44 98/46  Pulse: 96 96 100 100  Temp: 98.2 F (36.8 C) 99 F (37.2 C) 98.7 F (37.1 C) 98.3 F (36.8 C)  TempSrc: Oral Oral Oral   Resp: 20 20 20 19   Height:      Weight:      SpO2: 93% 94% 94% 93%    Neurologically intact Toes with good cap refill. EHL, FHL firing.   Splint intact. Neck nontender with full rom  Lab Results  Component Value Date   WBC 10.8* 05/24/2013   HGB 9.4* 05/24/2013   HCT 29.2* 05/24/2013   MCV 91.5 05/24/2013   PLT 161 05/24/2013     Assessment/Plan: 3 Days Post-Op   Left open ankle fracture  Advance diet Up with therapy Discharge to SNF Will consult SW.    Mild hyponatremia, asymptomatic, observe, recheck in am  I do not see indication for cervical workup. Cancel preop xr that was never done.  Charlene Watts P 05/25/2013, 8:51 AM   Teryl Lucy, MD Cell [phone]

## 2013-05-26 LAB — BASIC METABOLIC PANEL
Calcium: 8.1 mg/dL — ABNORMAL LOW (ref 8.4–10.5)
Creatinine, Ser: 1.04 mg/dL (ref 0.50–1.10)
GFR calc Af Amer: 64 mL/min — ABNORMAL LOW (ref >90)
Potassium: 3.9 mEq/L (ref 3.5–5.1)
Sodium: 136 mEq/L (ref 135–145)

## 2013-05-26 MED ORDER — DOCUSATE SODIUM 100 MG PO CAPS
100.0000 mg | ORAL_CAPSULE | Freq: Two times a day (BID) | ORAL | Status: DC
  Administered 2013-05-26 (×2): 100 mg via ORAL

## 2013-05-26 MED ORDER — BISACODYL 10 MG RE SUPP: 10.0000 mg | Freq: Every day | RECTAL | Status: DC | PRN

## 2013-05-26 MED ORDER — SENNA 8.6 MG PO TABS
1.0000 | ORAL_TABLET | Freq: Every day | ORAL | Status: DC
  Administered 2013-05-26 – 2013-05-28 (×2): 8.6 mg via ORAL
  Filled 2013-05-26 (×5): qty 1

## 2013-05-26 NOTE — Clinical Social Work Psychosocial (Signed)
Clinical Social Work Department BRIEF PSYCHOSOCIAL ASSESSMENT 05/26/2013  Patient:  Charlene Watts, Charlene Watts     Account Number:  [id number]     Admit date:  05/22/2013  Clinical Social Worker:  Sherre Lain  Date/Time:  05/26/2013 03:47 PM  Referred by:  Physician  Date Referred:  05/26/2013 Referred for  SNF Placement   Other Referral:   none.   Interview type:  Patient Other interview type:   none.    PSYCHOSOCIAL DATA Living Status:  FAMILY Admitted from facility:   Level of care:   Primary support name:  Ivery Quale Primary support relationship to patient:  SPOUSE Degree of support available:   Strong emotional support system, pt's husband is wheelchair bound.    CURRENT CONCERNS Current Concerns  Post-Acute Placement   Other Concerns:   none.    SOCIAL WORK ASSESSMENT / PLAN CSW met with pt at bedside. CSW explained CSW at [facility] to pt. Pt stated that she was agreeable to speaking with CSW. CSW informed pt of PT recommendation for SNF. Pt stated that she was going to go home, but she cares for her husband who is wheelchair bound so no one would be able to care for her. Pt stated that she is from [facility], [place], but would like for her clinical information to be fax[place] to [place]. Weekend CSW to follow-up with patient and present bed offers. CSW to continue to follow and assist with discharge planning needs.   Assessment/plan status:  Psychosocial Support/Ongoing Assessment of Needs Other assessment/ plan:   none.   Information/referral to community resources:   [place] SNF placement.    PATIENTS/FAMILYS RESPONSE TO PLAN OF CARE: Pt was understanding and agreeable to CSW plan of care.     Darlyn Chamber, LCSWA Clinical Social Worker [phone]

## 2013-05-26 NOTE — Progress Notes (Signed)
Physical Therapy Treatment Patient Details Name: Renelle Stegenga MRN: [id number] DOB: 02-11-1946 Today's Date: 05/26/2013 Time: 4098-1191 PT Time Calculation (min): 11 min  PT Assessment / Plan / Recommendation  History of Present Illness She [name] about 4 feet off of a ladder suffering an injury to her left foot. She was brought in by EMS with obvious open fracture to her left ankle. She denies other injury. She received morphine and root but still rates pain at 4/10. It feels better she keeps her foot still and is worse with any movement. She denies other injury. She denies loss of consciousness and denies head, neck, back, chest, abdomen injury. She does not on her last tetanus immunization was.  Pt is now s/p I& D and ORIF left ankle. Pt s/p fall 05/25/13 in bathroom at [facility]   PT Comments   Agree with dc plan update to SNF for continued rehab to maximize independence and safety with mobility prior to dc home; Agree also with previous OT note   Follow Up Recommendations  SNF     Does the patient have the potential to tolerate intense rehabilitation     Barriers to Discharge        Equipment Recommendations  Wheelchair (measurements PT)    Recommendations for Other Services    Frequency Min 4X/week   Progress towards PT Goals Progress towards PT goals: Progressing toward goals  Plan Current plan remains appropriate    Precautions / Restrictions Precautions Precautions: Fall Restrictions LLE Weight Bearing: Non weight bearing   Pertinent Vitals/Pain no apparent distress Pt did not describe LLE pain as much as feeling of LLE heaviness    Mobility  Bed Mobility Bed Mobility: Supine to Sit;Sitting - Scoot to [place] of Bed;Sit to Supine Supine to Sit: 4: Min guard;HOB elevated Sitting - Scoot to Edge of Bed: 5: Supervision Sit to Supine: 5: Supervision;HOB elevated Details for Bed Mobility Assistance: pretty smooth transition; cues for safety Transfers Transfers: Sit to Stand;Stand  to Sit Sit to Stand: 4: Min guard;With upper extremity assist;From bed Stand to Sit: 4: Min guard;With upper extremity assist;Other (comment) (w/c) Details for Transfer Assistance: pt required cues for safety and and hand placement, especially with stand to sit Ambulation/Gait Ambulation/Gait Assistance: 4: Min assist Ambulation Distance (Feet): 20 Feet Assistive device: Rolling walker Ambulation/Gait Assistance Details: Verbal and demo cues for RW use and to press body weight into RW as opposed to outright hopping; a few mini losses of balance Gait Pattern: Step-to pattern    Exercises     PT Diagnosis:    PT Problem List:   PT Treatment Interventions:     PT Goals (current goals can now be found in the care plan section) Acute Rehab PT Goals Patient Stated Goal: to go home and manage PT Goal Formulation: With patient Time For Goal Achievement: 05/30/13 Potential to Achieve Goals: Fair  Visit Information  Last PT Received On: 05/26/13 Assistance Needed: +1 History of Present Illness: She [name] about 4 feet off of a ladder suffering an injury to her left foot. She was brought in by EMS with obvious open fracture to her left ankle. She denies other injury. She received morphine and root but still rates pain at 4/10. It feels better she keeps her foot still and is worse with any movement. She denies other injury. She denies loss of consciousness and denies head, neck, back, chest, abdomen injury. She does not on her last tetanus immunization was.  Pt is now s/p I&  D and ORIF left ankle. Pt s/p fall 05/25/13 in bathroom at [facility]    Subjective Data  Subjective: Correctly stating her NWBing precautions; was able to descriibe fall she had and that she did not have assistance; required cues to put together that with assist she may not have fallen Patient Stated Goal: to go home and manage   Cognition  Cognition Arousal/Alertness: Awake/alert Behavior During Therapy: Flat  affect;Impulsive Overall Cognitive Status: Impaired/Different from baseline Area of Impairment: Safety/judgement;Memory;Problem solving Memory: Decreased short-term memory Following Commands: Follows one step commands with increased time Safety/Judgement: Decreased awareness of safety Awareness: Anticipatory Problem Solving: Difficulty sequencing;Requires verbal cues General Comments: v/c for safety and use of W/c. pt with poor recall from previous therapy session    Balance     End of Session PT - End of Session Equipment Utilized During Treatment: Gait belt Activity Tolerance: Patient tolerated treatment well Patient left: in chair;with call bell/phone within reach   GP     Van Clines [facility] [place], [place] [phone]  05/26/2013, 3:42 PM

## 2013-05-26 NOTE — Clinical Social Work Placement (Signed)
Clinical Social Work Department CLINICAL SOCIAL WORK PLACEMENT NOTE 05/26/2013  Patient:  Charlene Watts, Charlene Watts  Account Number:  [id number] Admit date:  05/22/2013  Clinical Social Worker:  Irving Burton [place], LCSWA  Date/time:  05/26/2013 03:51 PM  Clinical Social Work is seeking post-discharge placement for this patient at the following level of care:   SKILLED NURSING   (*CSW will update this form in Epic as items are completed)   05/26/2013  Patient/family provided with [facility] Department of Clinical Social Works list of facilities offering this level of care within the geographic area requested by the patient (or if unable, by the patients family).  05/26/2013  Patient/family informed of their freedom to choose among providers that offer the needed level of care, that participate in Medicare, Medicaid or managed care program needed by the patient, have an available bed and are willing to accept the patient.  05/26/2013  Patient/family informed of [facility] ownership interest in [facility], as well as of the fact that they are under no obligation to receive care at this facility.  PASARR submitted to EDS on 05/26/2013 PASARR number received from EDS on 05/26/2013  FL2 transmitted to all facilities in geographic area requested by pt/family on  05/26/2013 FL2 transmitted to all facilities within larger geographic area on   Patient informed that his/her managed care company has contracts with or will negotiate with  certain facilities, including the following:     Patient/family informed of bed offers received:   Patient chooses bed at  Physician recommends and patient chooses bed at    Patient to be transferred to  on   Patient to be transferred to facility by   The following physician request were entered in Epic:   Additional Comments:  Darlyn Chamber, Theresia Majors Clinical Social Worker [phone]

## 2013-05-26 NOTE — Progress Notes (Signed)
Patient ID: Charlene Watts, female   DOB: [birth year], 67 y.o.   MRN: [id number]     Subjective:  Patient reports pain as mild.  Patient states that she is doing okay.  [name] yesterday trying to get out of bathroom by herself.  No increased pain or problems reported.    Objective:   VITALS:   Filed Vitals:   05/25/13 1413 05/25/13 2115 05/26/13 0519 05/26/13 1301  BP: 90/42 95/45 105/45 117/52  Pulse: 96 100 95 87  Temp: 98 F (36.7 C) 98.3 F (36.8 C) 98.4 F (36.9 C) 97.9 F (36.6 C)  TempSrc: Oral Oral Oral Oral  Resp: 19 18 18 15   Height:      Weight:      SpO2: 93% 92% 92% 98%    ABD soft Sensation intact distally Dorsiflexion/Plantar flexion intact Incision: dressing C/D/I and no drainage Splint in place EHL/FHL firing   Lab Results  Component Value Date   WBC 10.8* 05/24/2013   HGB 9.4* 05/24/2013   HCT 29.2* 05/24/2013   MCV 91.5 05/24/2013   PLT 161 05/24/2013     Assessment/Plan: 4 Days Post-Op   Principal Problem:   Open left ankle fracture   Advance diet Up with therapy Plan for SNF placement on Monday NWB Lt lower ext   Torrie Mayers, BRANDON 05/26/2013, 1:05 PM   Teryl Lucy, MD Cell [phone]

## 2013-05-26 NOTE — Progress Notes (Signed)
Occupational Therapy Treatment Patient Details Name: Charlene Watts MRN: [id number] DOB: Aug 18, 1945 Today's Date: 05/26/2013 Time: 4098-1191 OT Time Calculation (min): 12 min  OT Assessment / Plan / Recommendation  History of present illness She [name] about 4 feet off of a ladder suffering an injury to her left foot. She was brought in by EMS with obvious open fracture to her left ankle. She denies other injury. She received morphine and root but still rates pain at 4/10. It feels better she keeps her foot still and is worse with any movement. She denies other injury. She denies loss of consciousness and denies head, neck, back, chest, abdomen injury. She does not on her last tetanus immunization was.  Pt is now s/p I& D and ORIF left ankle. Pt s/p fall 05/25/13 in bathroom at [facility]   OT comments  Pt with very flat affect and poor recall. Question if psych consult is needed for this patient. Pt with cognitive deficits. Pt requires (A) for all transfers and remains high fall risk.  Follow Up Recommendations  SNF;Supervision/Assistance - 24 hour    Barriers to Discharge       Equipment Recommendations  3 in 1 bedside comode;Wheelchair (measurements OT);Wheelchair cushion (measurements OT)    Recommendations for Other Services    Frequency Min 2X/week   Progress towards OT Goals Progress towards OT goals: Progressing toward goals  Plan Discharge plan remains appropriate    Precautions / Restrictions Precautions Precautions: Fall Restrictions LLE Weight Bearing: Non weight bearing   Pertinent Vitals/Pain None reported    ADL  ADL Comments: Pt with previous fall in the bathroom yesterday. Pt encouraged to practice 3n1 transfer. All transfers limited to stand pivot level to decr fall risk and reduce amount of dme being used. Pt reaching with Right uE for left arm rest which requires patient to complete 180 degree turn to sit. Pt educated on fall risk due to turning 180 degrees. Pt repeating  transfer to 3n1 x2 . Pt with flat affect and very littel response to detailed questions by therapist. Question psycho consult needed to help patient. Pt attempting to transfer and unable to lock w/c because of Lt elevated leg rest. Pt needed max cues to remove elevated leg rest. Pt repeats the same task again and again instead of changing errors.Pt educated on the need to have staff help with all transfers. at end of session patient asking if she can go to the bathroom alone. Pt again educated on the need for staff to assist.     OT Diagnosis:    OT Problem List:   OT Treatment Interventions:     OT Goals(current goals can now be found in the care plan section) Acute Rehab OT Goals Patient Stated Goal: to go home and manage OT Goal Formulation: With patient Time For Goal Achievement: 06/06/13 Potential to Achieve Goals: Good ADL Goals Pt Will Perform Grooming: with modified independence;standing Pt Will Perform Upper Body Bathing: with modified independence;standing Pt Will Perform Lower Body Bathing: with modified independence;sit to/from stand Pt Will Perform Upper Body Dressing: with modified independence;standing Pt Will Perform Lower Body Dressing: with modified independence;sit to/from stand Pt Will Transfer to Toilet: with modified independence;bedside commode Additional ADL Goal #1: Pt will complete bed mobility MOD i without HOB UP or rails  Visit Information  Last OT Received On: 05/26/13 Assistance Needed: +1 History of Present Illness: She [name] about 4 feet off of a ladder suffering an injury to her left foot. She was brought  in by EMS with obvious open fracture to her left ankle. She denies other injury. She received morphine and root but still rates pain at 4/10. It feels better she keeps her foot still and is worse with any movement. She denies other injury. She denies loss of consciousness and denies head, neck, back, chest, abdomen injury. She does not on her last tetanus  immunization was.  Pt is now s/p I& D and ORIF left ankle. Pt s/p fall 05/25/13 in bathroom at [facility]    Subjective Data      Prior Functioning       Cognition  Cognition Arousal/Alertness: Awake/alert Behavior During Therapy: Flat affect;Impulsive Overall Cognitive Status: Impaired/Different from baseline Area of Impairment: Safety/judgement;Memory;Problem solving Memory: Decreased short-term memory Following Commands: Follows one step commands with increased time Safety/Judgement: Decreased awareness of safety Awareness: Anticipatory Problem Solving: Difficulty sequencing;Requires verbal cues General Comments: v/c for safety and use of W/c. pt with poor recall from previous therapy session    Mobility  Bed Mobility Bed Mobility: Supine to Sit;Sitting - Scoot to Edge of Bed;Sit to Supine Supine to Sit: 4: Min guard;HOB elevated Sitting - Scoot to Edge of Bed: 5: Supervision Sit to Supine: 5: Supervision;HOB elevated Transfers Transfers: Sit to Stand;Stand to Sit Sit to Stand: 4: Min guard;With upper extremity assist;From bed Stand to Sit: 4: Min guard;With upper extremity assist;Other (comment) (w/c) Details for Transfer Assistance: pt required cues for safety and limited all transfers to stand pivot    Exercises      Balance     End of Session OT - End of Session Activity Tolerance: Patient tolerated treatment well Patient left: in bed;with call bell/phone within reach;with bed alarm set Nurse Communication: Mobility status;Precautions  GO     Harolyn Rutherford 05/26/2013, 1:27 PM Pager: [phone]

## 2013-05-26 NOTE — Progress Notes (Signed)
L foot, without swelling, + capillary refill, toes warm to touch, pain controlled, nwb status maintained.

## 2013-05-27 NOTE — Progress Notes (Signed)
Clinical Child psychotherapist (CSW) gave patient bed offers and encouraged patient to review the bed offers with her family. Patient verbalized her understanding.   Jetta Lout, LCSWA Weekend CSW [phone]

## 2013-05-27 NOTE — Progress Notes (Signed)
Patient ID: Charlene Watts, female   DOB: [birth year], 67 y.o.   MRN: [id number]     Subjective:  Patient reports pain as mild.  Patient states that she has not been active with PT in the past two days.  Encouraged her to be more active and to use the walker.  Objective:   VITALS:   Filed Vitals:   05/26/13 0519 05/26/13 1301 05/26/13 2124 05/27/13 0520  BP: 105/45 117/52 106/45 104/47  Pulse: 95 87 56   Temp: 98.4 F (36.9 C) 97.9 F (36.6 C) 98.4 F (36.9 C) 98.7 F (37.1 C)  TempSrc: Oral Oral Oral Oral  Resp: 18 15 18 16   Height:      Weight:      SpO2: 92% 98% 97% 97%    ABD soft Sensation intact distally Dorsiflexion/Plantar flexion intact Incision: dressing C/D/I and no drainage   Lab Results  Component Value Date   WBC 10.8* 05/24/2013   HGB 9.4* 05/24/2013   HCT 29.2* 05/24/2013   MCV 91.5 05/24/2013   PLT 161 05/24/2013     Assessment/Plan: 5 Days Post-Op   Principal Problem:   Open left ankle fracture   Advance diet Up with therapy Patient waiting on SNF placement on Monday NWB left lower ext. ABLA stable    Haskel Khan 05/27/2013, 10:15 AM  Patient seen and agree with above.  Charlene Lucy, MD Cell [phone]

## 2013-05-28 MED ORDER — DOCUSATE SODIUM 100 MG PO CAPS: 100.0000 mg | ORAL_CAPSULE | Freq: Two times a day (BID) | ORAL | Status: DC | PRN

## 2013-05-28 NOTE — Progress Notes (Signed)
Patient ID: Charlene Watts, female   DOB: 09-24-1945, 67 y.o.   MRN: [id number]     Subjective:  Patient reports pain as mild.  Patient states that she would like to begin to exercise the knee and leg.  Have encouraged her to do that and to get up with the walker with PT, but she should not be weight bearing on the left lower ext.  Objective:   VITALS:   Filed Vitals:   05/27/13 1500 05/27/13 1740 05/27/13 2202 05/28/13 0628  BP: 113/53 115/58 124/58 115/57  Pulse: 75 89 88 91  Temp: 98.5 F (36.9 C) 98.1 F (36.7 C) 98.5 F (36.9 C) 98.5 F (36.9 C)  TempSrc: Oral Oral Oral Oral  Resp: 18 20 18 16   Height:      Weight:      SpO2: 98% 98% 94% 97%    ABD soft Sensation intact distally Dorsiflexion/Plantar flexion intact Incision: dressing C/D/I and no drainage patient has 0-110 degrees of motion at the knee. Able to move all toes  Has sensation in all toes   Lab Results  Component Value Date   WBC 10.8* 05/24/2013   HGB 9.4* 05/24/2013   HCT 29.2* 05/24/2013   MCV 91.5 05/24/2013   PLT 161 05/24/2013     Assessment/Plan: 6 Days Post-Op   Principal Problem:   Open left ankle fracture   Advance diet Up with therapy Plan for discharge tomorrow NWB left lower ext. ABLA stable Plan for SNF tomorrow   Haskel Khan 05/28/2013, 9:50 AM  Discussed with Janace Litten, and agree with above.  Teryl Lucy, MD Cell [phone]

## 2013-05-29 ENCOUNTER — Encounter (HOSPITAL_COMMUNITY): Payer: Self-pay | Admitting: Orthopedic Surgery

## 2013-05-29 NOTE — Discharge Summary (Signed)
Physician Discharge Summary  Patient ID: Charlene Watts MRN: [id number] DOB/AGE: Oct 13, 1945 67 y.o.  Admit date: 05/22/2013 Discharge date: 05/29/2013  Admission Diagnoses:  Open left ankle fracture  Discharge Diagnoses:  Principal Problem:   Open left ankle fracture   Past Medical History  Diagnosis Date  . Mitral valve prolapse   . Hypertension   . Hyperlipemia   . (HFpEF) heart failure with preserved ejection fraction   . Heart murmur   . Depression   . Chronic kidney disease (CKD), stage III (moderate)     Surgeries: Procedure(s): IRRIGATION AND DEBRIDEMENT EXTREMITY OPEN REDUCTION INTERNAL FIXATION (ORIF) ANKLE FRACTURE on 05/22/2013 - 05/23/2013   Consultants (if any): Treatment Team:  Sheral Apley, MD  Discharged Condition: Improved  Hospital Course: Charlene Watts is an 67 y.o. female who was admitted 05/22/2013 with a diagnosis of Open left ankle fracture and went to the operating room on 05/22/2013 - 05/23/2013 and underwent the above named procedures.    She was given perioperative antibiotics:  Anti-infectives   Start     Dose/Rate Route Frequency Ordered Stop   05/23/13 0400  ceFAZolin (ANCEF) IVPB 2 g/50 mL premix     2 g 100 mL/hr over 30 Minutes Intravenous Every 6 hours 05/23/13 0251 05/23/13 1607   05/22/13 2145  ceFAZolin (ANCEF) IVPB 1 g/50 mL premix     1 g 100 mL/hr over 30 Minutes Intravenous  Once 05/22/13 2139 05/22/13 2251    .  She was given sequential compression devices, early ambulation, and ASA for DVT prophylaxis.  She benefited maximally from the hospital stay and there were no complications.    Recent vital signs:  Filed Vitals:   05/29/13 0516  BP: 117/63  Pulse: 89  Temp: 98.4 F (36.9 C)  Resp: 17    Recent laboratory studies:  Lab Results  Component Value Date   HGB 9.4* 05/24/2013   HGB 11.8* 05/22/2013   Lab Results  Component Value Date   WBC 10.8* 05/24/2013   PLT 161 05/24/2013   No results found for  this basename: INR   Lab Results  Component Value Date   NA 136 05/26/2013   K 3.9 05/26/2013   CL 100 05/26/2013   CO2 31 05/26/2013   BUN 11 05/26/2013   CREATININE 1.04 05/26/2013   GLUCOSE 83 05/26/2013    Discharge Medications:     Medication List         aspirin 325 MG EC tablet  Take 1 tablet (325 mg total) by mouth daily.     buPROPion 150 MG 12 hr tablet  Commonly known as:  WELLBUTRIN SR  Take 150 mg by mouth 2 (two) times daily.     FLUoxetine 20 MG capsule  Commonly known as:  PROZAC  Take 20 mg by mouth daily.     lisinopril-hydrochlorothiazide 20-12.5 MG per tablet  Commonly known as:  PRINZIDE,ZESTORETIC  Take 1 tablet by mouth daily.     omega-3 acid ethyl esters 1 G capsule  Commonly known as:  LOVAZA  Take 1 g by mouth 2 (two) times daily.     oxyCODONE 5 MG immediate release tablet  Commonly known as:  ROXICODONE  Take 1 tablet (5 mg total) by mouth every 4 (four) hours as needed for severe pain.     pravastatin 80 MG tablet  Commonly known as:  PRAVACHOL  Take 80 mg by mouth daily.     raloxifene 60 MG tablet  Commonly known as:  EVISTA  Take 60 mg by mouth daily.        Diagnostic Studies: Dg Ankle 2 Views Left  05/23/2013   CLINICAL DATA:  ORIF left ankle.  EXAM: LEFT ANKLE - 2 VIEW  COMPARISON:  05/22/2013  FINDINGS: Intraoperative fluoroscopy is utilized for surgical control purposes, demonstrating interval reduction and internal fixation of previous fractures of the left ankle. There is plate and screw fixation of the distal fibula, screw fixation of the medial malleolus, and screw fixation of the tibia fibular joint. Alignment and position appears anatomic. Fluoroscopy time is not indicated.  IMPRESSION: Intraoperative fluoroscopy utilized for surgical control purposes, demonstrating internal fixation of medial and lateral malleolar fractures.   Electronically Signed   By: Burman Nieves M.D.   On: 05/23/2013 01:32   Dg Ankle 2 Views  Left  05/22/2013   CLINICAL DATA:  Recent traumatic injury with ankle pain  EXAM: LEFT ANKLE - 2 VIEW  COMPARISON:  None.  FINDINGS: There fractures of both the distal tibia and distal fibula. Significant deformation is noted with posterior displacement of the tibia with respect to the talus as well as apparent lateral displacement and rotation of the foot with respect to the tibia. The distal fibular fragment appears to be appropriately located with respect to the talus.  IMPRESSION: Significant fracture dislocation of the ankle joint with apparent posterior medial displacement of the tibia with respect to the talus and remainder of the foot.   Electronically Signed   By: Alcide Clever M.D.   On: 05/22/2013 21:13   Dg Ankle Left Port  05/23/2013   CLINICAL DATA:  Postop ORIF left ankle fracture  EXAM: PORTABLE LEFT ANKLE - 2 VIEW  COMPARISON:  Intraoperative films of 05/23/2013 and preoperative films of 05/22/2013.  FINDINGS: Screws have been placed for fixation of the displaced medial malleolar fracture. Plate and screw fixation of the displaced distal left fibular fracture has been performed with good position and alignment maintained. The ankle joint is unremarkable.  IMPRESSION: ORIF of medial and lateral malleolar fractures.   Electronically Signed   By: Dwyane Dee M.D.   On: 05/23/2013 08:01    Disposition: Final discharge disposition not confirmed      Discharge Orders   Future Orders Complete By Expires   Non weight bearing  As directed    Questions:     Laterality:     Extremity:        Follow-up Information   Follow up with Margarita Rana, D, MD. Schedule an appointment as soon as possible for a visit in 2 weeks.   Specialty:  Orthopedic Surgery   Contact information:   [address] [place] [address] [phone]        Signed: Sheral Apley 05/29/2013, 7:18 AM

## 2013-05-29 NOTE — Clinical Social Work Note (Signed)
CSW met with pt at bedside to confirm pt's SNF placement choice. Pt stated that she has chosen [facility] and [facility] [phone]). CSW informed pt that CSW is working with pt's insurance to get pt into a SNF as soon as possible. CSW contacted [facility] and left message with admissions coordinator. CSW also contacted [facility] representative and left a message regarding pt's readiness for discharge and pt's SNF placement choice. CSW currently awaiting insurance approval for SNF placement.   CSW to continue to follow and assist with discharge planning needs. CSW followed-up with RN regarding information above.  CSW requesting MD to please sign FL2 (located on shadow chart) as soon as able.  Darlyn Chamber, LCSWA Clinical Social Worker [phone]

## 2013-05-29 NOTE — Progress Notes (Signed)
Physical Therapy Treatment Patient Details Name: Charlene Watts MRN: [id number] DOB: Nov 08, 1945 Today's Date: 05/29/2013 Time: 4098-1191 PT Time Calculation (min): 15 min  PT Assessment / Plan / Recommendation  History of Present Illness She [name] about 4 feet off of a ladder suffering an injury to her left foot. She was brought in by EMS with obvious open fracture to her left ankle. She denies other injury. She received morphine and root but still rates pain at 4/10. It feels better she keeps her foot still and is worse with any movement. She denies other injury. She denies loss of consciousness and denies head, neck, back, chest, abdomen injury. She does not on her last tetanus immunization was.  Pt is now s/p I& D and ORIF left ankle. Pt s/p fall 05/25/13 in bathroom at [facility]   PT Comments   Progressing with endurance, able to walk twice during session.  Feel SNF rehab appropriate to allow maximal time to heal and allow weight bearing for increased safety with mobility.  Will continue to follow until D/C.  Follow Up Recommendations  SNF     Does the patient have the potential to tolerate intense rehabilitation   N/A  Barriers to Discharge  None      Equipment Recommendations  Wheelchair (measurements PT)    Recommendations for Other Services  None  Frequency Min 3X/week   Progress towards PT Goals Progress towards PT goals: Progressing toward goals  Plan Current plan remains appropriate;Frequency needs to be updated    Precautions / Restrictions Precautions Precautions: Fall Restrictions Weight Bearing Restrictions: Yes LLE Weight Bearing: Non weight bearing   Pertinent Vitals/Pain 2/10 left ankle with mobility    Mobility  Bed Mobility Bed Mobility: Not assessed Transfers Sit to Stand: 4: Min guard;With upper extremity assist;From chair/3-in-1 Stand to Sit: 4: Min guard;With upper extremity assist;To chair/3-in-1 Details for Transfer Assistance: cues for hand placement and  safety with up from 3:1 in bathroom in front of sink with pt holding sink, but leaning to reach walker with unsafe technique Ambulation/Gait Ambulation/Gait Assistance: 4: Min guard Ambulation Distance (Feet): 20 Feet (x 2) Assistive device: Rolling walker Ambulation/Gait Assistance Details: fatigues quickly with assist needed for safety Gait Pattern: Decreased stride length;Step-to pattern Wheelchair Mobility Wheelchair Assistance: 5: Supervision Wheelchair Assistance Details (indicate cue type and reason): verbal and demonstrating cues for wheelchair set up for transfer Wheelchair Propulsion: Both upper extremities Wheelchair Parts Management: Needs assistance Distance: 300      PT Goals (current goals can now be found in the care plan section) Acute Rehab PT Goals Patient Stated Goal: to go home and manage  Visit Information  Last PT Received On: 05/29/13 Assistance Needed: +1 History of Present Illness: She [name] about 4 feet off of a ladder suffering an injury to her left foot. She was brought in by EMS with obvious open fracture to her left ankle. She denies other injury. She received morphine and root but still rates pain at 4/10. It feels better she keeps her foot still and is worse with any movement. She denies other injury. She denies loss of consciousness and denies head, neck, back, chest, abdomen injury. She does not on her last tetanus immunization was.  Pt is now s/p I& D and ORIF left ankle. Pt s/p fall 05/25/13 in bathroom at [facility]    Subjective Data  Patient Stated Goal: to go home and manage   Cognition  Cognition Arousal/Alertness: Awake/alert Behavior During Therapy: Flat affect;Impulsive Overall Cognitive Status: Impaired/Different  from baseline Area of Impairment: Safety/judgement;Memory;Problem solving Safety/Judgement: Decreased awareness of safety Awareness: Anticipatory Problem Solving: Difficulty sequencing;Requires verbal cues    Balance   Balance Balance Assessed: Yes Dynamic Standing Balance Dynamic Standing - Balance Support: Bilateral upper extremity supported;During functional activity Dynamic Standing - Level of Assistance: 4: Min assist Dynamic Standing - Comments: after finishing grooming at sink stood and reached for walker with unsafe technique; minguard needed for safety (moves quickly)  End of Session PT - End of Session Equipment Utilized During Treatment: Gait belt Activity Tolerance: Patient limited by fatigue Patient left: in chair;with call bell/phone within reach   GP     [facility] 05/29/2013, 11:23 AM Sheran Lawless, PT [phone] 05/29/2013

## 2013-05-29 NOTE — Progress Notes (Signed)
Occupational Therapy Treatment Patient Details Name: Charlene Watts MRN: [id number] DOB: 1945-10-02 Today's Date: 05/29/2013 Time: 4098-1191 OT Time Calculation (min): 23 min  OT Assessment / Plan / Recommendation  History of present illness She [name] about 4 feet off of a ladder suffering an injury to her left foot. She was brought in by EMS with obvious open fracture to her left ankle. She denies other injury. She received morphine and root but still rates pain at 4/10. It feels better she keeps her foot still and is worse with any movement. She denies other injury. She denies loss of consciousness and denies head, neck, back, chest, abdomen injury. She does not on her last tetanus immunization was.  Pt is now s/p I& D and ORIF left ankle. Pt s/p fall 05/25/13 in bathroom at [facility]   OT comments  Pt completing bathroom level adls this session with continues safety concerns. Pt very impulsive and quickly abandons RW. Pt remains high fall risk. Pt should have (A) when moving around in room. Pt is unsafe to ambulate independently at this time.  Follow Up Recommendations  SNF;Supervision/Assistance - 24 hour    Barriers to Discharge       Equipment Recommendations  3 in 1 bedside comode;Wheelchair (measurements OT);Wheelchair cushion (measurements OT)    Recommendations for Other Services    Frequency Min 2X/week   Progress towards OT Goals Progress towards OT goals: Progressing toward goals  Plan Discharge plan remains appropriate    Precautions / Restrictions Precautions Precautions: Fall Restrictions LLE Weight Bearing: Non weight bearing   Pertinent Vitals/Pain None reported Flat affect    ADL  Eating/Feeding: Independent Where Assessed - Eating/Feeding: Bed level Grooming: Wash/dry hands;Wash/dry face;Teeth care;Brushing hair;Set up Where Assessed - Grooming: Unsupported sitting Upper Body Bathing: Chest;Right arm;Left arm;Abdomen;Set up Where Assessed - Upper Body Bathing:  Unsupported sitting Lower Body Bathing: Min guard Where Assessed - Lower Body Bathing: Unsupported sit to stand (using UE on counter for support) Upper Body Dressing: Supervision/safety Where Assessed - Upper Body Dressing: Unsupported sitting Lower Body Dressing: Min guard Where Assessed - Lower Body Dressing: Unsupported sit to stand Toilet Transfer: Min Pension scheme manager Method: Sit to Barista: Raised toilet seat with arms (or 3-in-1 over toilet) Equipment Used: Rolling walker Transfers/Ambulation Related to ADLs: Pt impulsive and standing in room alone at chair with RW on arrival. pt with Lt NWB knee in chair reaching in bags. pt s/p x1 fall and has been advised to avoid ambulation alone. Pt assisted with bathroom setup for bathing ADL Comments: Pt ambulated to bathroom min guard with RW. pt impulsive attempting pt to abandon RW and pull 3n1 closer to sink surface. Pt assisted with this task and pt insisting she can do it indep level. Pt unsteady with RW and less steady attempting to only use counter support to balance and move 3n1.  Pt complete bathing in a seated position at sink level. pt was able to complete dressing in seated position. Pt moving very quickly with RW exiting bathroom and needed cues to decr speed. Pt remains with flat affect and poor safety awareness. Pt requesting to speak with SW regarding selection for SNF d/c today    OT Diagnosis:    OT Problem List:   OT Treatment Interventions:     OT Goals(current goals can now be found in the care plan section) Acute Rehab OT Goals Patient Stated Goal: to go home and manage OT Goal Formulation: With patient Time For Goal  Achievement: 06/06/13 Potential to Achieve Goals: Good ADL Goals Pt Will Perform Grooming: with modified independence;standing Pt Will Perform Upper Body Bathing: with modified independence;standing Pt Will Perform Lower Body Bathing: with modified independence;sit to/from  stand Pt Will Perform Upper Body Dressing: with modified independence;standing Pt Will Perform Lower Body Dressing: with modified independence;sit to/from stand Pt Will Transfer to Toilet: with modified independence;bedside commode Additional ADL Goal #1: Pt will complete bed mobility MOD i without HOB UP or rails  Visit Information  Last OT Received On: 05/29/13 Assistance Needed: +1 History of Present Illness: She [name] about 4 feet off of a ladder suffering an injury to her left foot. She was brought in by EMS with obvious open fracture to her left ankle. She denies other injury. She received morphine and root but still rates pain at 4/10. It feels better she keeps her foot still and is worse with any movement. She denies other injury. She denies loss of consciousness and denies head, neck, back, chest, abdomen injury. She does not on her last tetanus immunization was.  Pt is now s/p I& D and ORIF left ankle. Pt s/p fall 05/25/13 in bathroom at [facility]    Subjective Data      Prior Functioning       Cognition  Cognition Arousal/Alertness: Awake/alert Behavior During Therapy: Flat affect;Impulsive Overall Cognitive Status: Impaired/Different from baseline Area of Impairment: Safety/judgement;Memory;Problem solving Safety/Judgement: Decreased awareness of safety Awareness: Anticipatory Problem Solving: Difficulty sequencing;Requires verbal cues    Mobility  Bed Mobility Bed Mobility: Not assessed (was standing alone in RW on arrival;) Transfers Transfers: Sit to Stand;Stand to Sit Sit to Stand: 4: Min guard;With upper extremity assist;From chair/3-in-1 Stand to Sit: 4: Min guard;With upper extremity assist;To chair/3-in-1 Details for Transfer Assistance: cues for RW placement and safety in small bathroom spaces    Exercises      Balance     End of Session OT - End of Session Activity Tolerance: Patient tolerated treatment well Patient left: Other (comment) (PT Cyndi ) Nurse  Communication: Mobility status;Precautions  GO     Charlene Watts 05/29/2013, 9:24 AM Pager: [phone]

## 2013-05-29 NOTE — Progress Notes (Signed)
Patient returned from Endo via wheelchair.  Patient ambulated to bathroom first.  Settled back into bed.  VSS. Pt complaining of pain 7/10 and meds given as ordered.  Will cont to monitor.

## 2013-05-30 NOTE — Progress Notes (Addendum)
Spoke with daughter Marylene Land, she will be at hospital at 3:00 pm to take pt to [facility] d/t pt being authorized for SNF

## 2013-05-30 NOTE — Progress Notes (Signed)
Pt discharged to [facility] in stable condition.  Report called at this time to [place], [place].  Pt will be transferred to [facility] via personal vehicle by daughter Marylene Land.  Discharge instructions and information packet given to Marylene Land, instructed to give to admissions upon arrival.

## 2013-05-30 NOTE — Clinical Social Work Note (Addendum)
CSW attempted to contact [facility] liaison regarding insurance authorization for SNF placement at [facility]. CSW continuing to follow and attempt to contact [facility]. CSW to follow-up with [facility].  2:43pm CSW received insurance authorization for SNF placement at [facility]. CSW confirmed SNF placement with [facility] admissions coordinator. Pt to be discharged from [facility] on 05/30/2013 to [facility]. Pt's daughter to transport pt via car to room 503.  Darlyn Chamber, LCSWA Clinical Social Worker [phone]

## 2013-05-30 NOTE — Progress Notes (Signed)
    Subjective:  Patient reports pain as mild  Objective:   VITALS:   Filed Vitals:   05/29/13 0516 05/29/13 1500 05/29/13 2151 05/30/13 0621  BP: 117/63 115/62 127/61 130/59  Pulse: 89 88 86 85  Temp: 98.4 F (36.9 C) 98.6 F (37 C) 98.1 F (36.7 C) 97.5 F (36.4 C)  TempSrc:  Oral Oral Oral  Resp: 17 16 20 20   Height:      Weight:      SpO2: 98% 99% 97% 100%    Physical Exam   Dressing: C/D/I  Compartments soft  wiggles toes, WWP  LABS  No results found for this or any previous visit (from the past 24 hour(s)).   Assessment/Plan: 8 Days Post-Op   Principal Problem:   Open left ankle fracture   PLAN: Weight Bearing: NWB Dressings: C/d/I VTE prophylaxis: ASA 325, scd's Dispo: insurance approval delayed discharge yesterday. Will send to SNF vs air today   Margarita Rana, D 05/30/2013, 10:07 AM   Margarita Rana, MD Cell [phone]

## 2018-01-10 DIAGNOSIS — H25812 Combined forms of age-related cataract, left eye: Secondary | ICD-10-CM | POA: Diagnosis not present

## 2018-01-10 DIAGNOSIS — H353131 Nonexudative age-related macular degeneration, bilateral, early dry stage: Secondary | ICD-10-CM | POA: Diagnosis not present

## 2019-07-25 ENCOUNTER — Other Ambulatory Visit: Payer: Self-pay | Admitting: Family Medicine

## 2019-07-25 DIAGNOSIS — E2839 Other primary ovarian failure: Secondary | ICD-10-CM

## 2019-08-27 ENCOUNTER — Ambulatory Visit: Payer: Medicare Other | Attending: Internal Medicine

## 2019-08-27 DIAGNOSIS — Z23 Encounter for immunization: Secondary | ICD-10-CM | POA: Insufficient documentation

## 2019-08-27 NOTE — Progress Notes (Signed)
   Covid-19 Vaccination Clinic  Name:  Charlene Watts    MRN: [id number] DOB: [birth year]  08/27/2019  [name] was observed post Covid-19 immunization for 15 minutes without incidence. She was provided with Vaccine Information Sheet and instruction to access the V-Safe system.   Ms. Circle was instructed to call 911 with any severe reactions post vaccine: Marland Kitchen Difficulty breathing  . Swelling of your face and throat  . A fast heartbeat  . A bad rash all over your body  . Dizziness and weakness    Immunizations Administered    Name Date Dose VIS Date Route   Pfizer COVID-19 Vaccine 08/27/2019  4:48 PM 0.3 mL 06/09/2019 Intramuscular   Manufacturer: [employer], [employer]   Lot: [id number]   NDC: [address]-5

## 2019-09-26 ENCOUNTER — Ambulatory Visit: Payer: Medicare Other | Attending: Internal Medicine

## 2019-09-26 DIAGNOSIS — Z23 Encounter for immunization: Secondary | ICD-10-CM

## 2019-09-26 NOTE — Progress Notes (Signed)
   Covid-19 Vaccination Clinic  Name:  Destane Speas    MRN: [id number] DOB: 06-04-1946  09/26/2019  Ms. Eastep was observed post Covid-19 immunization for 15 minutes without incident. She was provided with Vaccine Information Sheet and instruction to access the V-Safe system.   Ms. Englert was instructed to call 911 with any severe reactions post vaccine: Marland Kitchen Difficulty breathing  . Swelling of face and throat  . A fast heartbeat  . A bad rash all over body  . Dizziness and weakness   Immunizations Administered    Name Date Dose VIS Date Route   Pfizer COVID-19 Vaccine 09/26/2019 10:11 AM 0.3 mL 06/09/2019 Intramuscular   Manufacturer: [employer], [employer]   Lot: [id number]   NDC: [address]-7

## 2019-10-10 ENCOUNTER — Other Ambulatory Visit: Payer: No Typology Code available for payment source

## 2020-04-03 ENCOUNTER — Other Ambulatory Visit: Payer: Medicare Other

## 2020-07-18 ENCOUNTER — Ambulatory Visit
Admission: RE | Admit: 2020-07-18 | Discharge: 2020-07-18 | Disposition: A | Discharge status: Home / Self Care | Payer: Medicare Other | Source: Ambulatory Visit | Attending: Family Medicine | Admitting: Family Medicine

## 2020-07-18 ENCOUNTER — Other Ambulatory Visit: Payer: Self-pay

## 2020-07-18 DIAGNOSIS — Z780 Asymptomatic menopausal state: Secondary | ICD-10-CM | POA: Diagnosis not present

## 2020-07-18 DIAGNOSIS — E2839 Other primary ovarian failure: Secondary | ICD-10-CM

## 2020-07-18 DIAGNOSIS — M8589 Other specified disorders of bone density and structure, multiple sites: Secondary | ICD-10-CM | POA: Diagnosis not present

## 2020-07-22 DIAGNOSIS — Z0000 Encounter for general adult medical examination without abnormal findings: Secondary | ICD-10-CM | POA: Diagnosis not present

## 2020-07-22 DIAGNOSIS — E7800 Pure hypercholesterolemia, unspecified: Secondary | ICD-10-CM | POA: Diagnosis not present

## 2020-07-22 DIAGNOSIS — K219 Gastro-esophageal reflux disease without esophagitis: Secondary | ICD-10-CM | POA: Diagnosis not present

## 2020-07-22 DIAGNOSIS — Z1211 Encounter for screening for malignant neoplasm of colon: Secondary | ICD-10-CM | POA: Diagnosis not present

## 2020-07-22 DIAGNOSIS — M8588 Other specified disorders of bone density and structure, other site: Secondary | ICD-10-CM | POA: Diagnosis not present

## 2020-07-22 DIAGNOSIS — I129 Hypertensive chronic kidney disease with stage 1 through stage 4 chronic kidney disease, or unspecified chronic kidney disease: Secondary | ICD-10-CM | POA: Diagnosis not present

## 2020-07-22 DIAGNOSIS — N1831 Chronic kidney disease, stage 3a: Secondary | ICD-10-CM | POA: Diagnosis not present

## 2020-07-22 DIAGNOSIS — H3530 Unspecified macular degeneration: Secondary | ICD-10-CM | POA: Diagnosis not present

## 2020-09-04 DIAGNOSIS — Z1212 Encounter for screening for malignant neoplasm of rectum: Secondary | ICD-10-CM | POA: Diagnosis not present

## 2020-09-04 DIAGNOSIS — Z1211 Encounter for screening for malignant neoplasm of colon: Secondary | ICD-10-CM | POA: Diagnosis not present

## 2020-09-13 LAB — COLOGUARD: COLOGUARD: NEGATIVE

## 2021-01-20 DIAGNOSIS — N1831 Chronic kidney disease, stage 3a: Secondary | ICD-10-CM | POA: Diagnosis not present

## 2021-01-20 DIAGNOSIS — I129 Hypertensive chronic kidney disease with stage 1 through stage 4 chronic kidney disease, or unspecified chronic kidney disease: Secondary | ICD-10-CM | POA: Diagnosis not present

## 2021-01-20 DIAGNOSIS — E7800 Pure hypercholesterolemia, unspecified: Secondary | ICD-10-CM | POA: Diagnosis not present

## 2021-03-21 DIAGNOSIS — H353131 Nonexudative age-related macular degeneration, bilateral, early dry stage: Secondary | ICD-10-CM | POA: Diagnosis not present

## 2021-08-05 DIAGNOSIS — I129 Hypertensive chronic kidney disease with stage 1 through stage 4 chronic kidney disease, or unspecified chronic kidney disease: Secondary | ICD-10-CM | POA: Diagnosis not present

## 2021-08-05 DIAGNOSIS — Z23 Encounter for immunization: Secondary | ICD-10-CM | POA: Diagnosis not present

## 2021-08-05 DIAGNOSIS — M8588 Other specified disorders of bone density and structure, other site: Secondary | ICD-10-CM | POA: Diagnosis not present

## 2021-08-05 DIAGNOSIS — Z0000 Encounter for general adult medical examination without abnormal findings: Secondary | ICD-10-CM | POA: Diagnosis not present

## 2021-08-05 DIAGNOSIS — H3530 Unspecified macular degeneration: Secondary | ICD-10-CM | POA: Diagnosis not present

## 2021-08-05 DIAGNOSIS — N1831 Chronic kidney disease, stage 3a: Secondary | ICD-10-CM | POA: Diagnosis not present

## 2021-08-05 DIAGNOSIS — E7800 Pure hypercholesterolemia, unspecified: Secondary | ICD-10-CM | POA: Diagnosis not present

## 2021-08-05 DIAGNOSIS — K219 Gastro-esophageal reflux disease without esophagitis: Secondary | ICD-10-CM | POA: Diagnosis not present

## 2022-02-02 DIAGNOSIS — Z23 Encounter for immunization: Secondary | ICD-10-CM | POA: Diagnosis not present

## 2022-02-02 DIAGNOSIS — N1831 Chronic kidney disease, stage 3a: Secondary | ICD-10-CM | POA: Diagnosis not present

## 2022-02-02 DIAGNOSIS — I129 Hypertensive chronic kidney disease with stage 1 through stage 4 chronic kidney disease, or unspecified chronic kidney disease: Secondary | ICD-10-CM | POA: Diagnosis not present

## 2022-02-02 DIAGNOSIS — E7800 Pure hypercholesterolemia, unspecified: Secondary | ICD-10-CM | POA: Diagnosis not present

## 2022-03-25 DIAGNOSIS — H353131 Nonexudative age-related macular degeneration, bilateral, early dry stage: Secondary | ICD-10-CM | POA: Diagnosis not present

## 2022-04-23 DIAGNOSIS — Z1231 Encounter for screening mammogram for malignant neoplasm of breast: Secondary | ICD-10-CM | POA: Diagnosis not present

## 2022-08-14 DIAGNOSIS — F32A Depression, unspecified: Secondary | ICD-10-CM | POA: Diagnosis not present

## 2022-08-14 DIAGNOSIS — U071 COVID-19: Secondary | ICD-10-CM | POA: Diagnosis not present

## 2022-08-14 DIAGNOSIS — R61 Generalized hyperhidrosis: Secondary | ICD-10-CM | POA: Diagnosis not present

## 2022-08-14 DIAGNOSIS — R404 Transient alteration of awareness: Secondary | ICD-10-CM | POA: Diagnosis not present

## 2022-08-14 DIAGNOSIS — Z743 Need for continuous supervision: Secondary | ICD-10-CM | POA: Diagnosis not present

## 2022-08-14 DIAGNOSIS — R059 Cough, unspecified: Secondary | ICD-10-CM | POA: Diagnosis not present

## 2022-08-14 DIAGNOSIS — R519 Headache, unspecified: Secondary | ICD-10-CM | POA: Diagnosis not present

## 2022-08-14 DIAGNOSIS — Z79899 Other long term (current) drug therapy: Secondary | ICD-10-CM | POA: Diagnosis not present

## 2022-08-14 DIAGNOSIS — N189 Chronic kidney disease, unspecified: Secondary | ICD-10-CM | POA: Diagnosis not present

## 2022-08-14 DIAGNOSIS — R1110 Vomiting, unspecified: Secondary | ICD-10-CM | POA: Diagnosis not present

## 2022-08-14 DIAGNOSIS — E785 Hyperlipidemia, unspecified: Secondary | ICD-10-CM | POA: Diagnosis not present

## 2022-08-14 DIAGNOSIS — R079 Chest pain, unspecified: Secondary | ICD-10-CM | POA: Diagnosis not present

## 2022-08-14 DIAGNOSIS — I129 Hypertensive chronic kidney disease with stage 1 through stage 4 chronic kidney disease, or unspecified chronic kidney disease: Secondary | ICD-10-CM | POA: Diagnosis not present

## 2022-08-14 DIAGNOSIS — L539 Erythematous condition, unspecified: Secondary | ICD-10-CM | POA: Diagnosis not present

## 2022-08-14 DIAGNOSIS — R531 Weakness: Secondary | ICD-10-CM | POA: Diagnosis not present

## 2022-08-14 DIAGNOSIS — M316 Other giant cell arteritis: Secondary | ICD-10-CM | POA: Diagnosis not present

## 2022-08-14 DIAGNOSIS — R0981 Nasal congestion: Secondary | ICD-10-CM | POA: Diagnosis not present

## 2022-08-14 DIAGNOSIS — Z7952 Long term (current) use of systemic steroids: Secondary | ICD-10-CM | POA: Diagnosis not present

## 2022-08-16 DIAGNOSIS — U071 COVID-19: Secondary | ICD-10-CM | POA: Diagnosis not present

## 2022-09-01 DIAGNOSIS — N1831 Chronic kidney disease, stage 3a: Secondary | ICD-10-CM | POA: Diagnosis not present

## 2022-09-01 DIAGNOSIS — H3530 Unspecified macular degeneration: Secondary | ICD-10-CM | POA: Diagnosis not present

## 2022-09-01 DIAGNOSIS — M8588 Other specified disorders of bone density and structure, other site: Secondary | ICD-10-CM | POA: Diagnosis not present

## 2022-09-01 DIAGNOSIS — I129 Hypertensive chronic kidney disease with stage 1 through stage 4 chronic kidney disease, or unspecified chronic kidney disease: Secondary | ICD-10-CM | POA: Diagnosis not present

## 2022-09-01 DIAGNOSIS — U071 COVID-19: Secondary | ICD-10-CM | POA: Diagnosis not present

## 2022-09-01 DIAGNOSIS — K219 Gastro-esophageal reflux disease without esophagitis: Secondary | ICD-10-CM | POA: Diagnosis not present

## 2022-09-01 DIAGNOSIS — E7800 Pure hypercholesterolemia, unspecified: Secondary | ICD-10-CM | POA: Diagnosis not present

## 2022-09-01 DIAGNOSIS — Z0000 Encounter for general adult medical examination without abnormal findings: Secondary | ICD-10-CM | POA: Diagnosis not present

## 2022-09-02 ENCOUNTER — Other Ambulatory Visit: Payer: Self-pay | Admitting: Family Medicine

## 2022-09-02 DIAGNOSIS — M8588 Other specified disorders of bone density and structure, other site: Secondary | ICD-10-CM

## 2023-03-03 DIAGNOSIS — I129 Hypertensive chronic kidney disease with stage 1 through stage 4 chronic kidney disease, or unspecified chronic kidney disease: Secondary | ICD-10-CM | POA: Diagnosis not present

## 2023-03-03 DIAGNOSIS — E7800 Pure hypercholesterolemia, unspecified: Secondary | ICD-10-CM | POA: Diagnosis not present

## 2023-03-03 DIAGNOSIS — N1831 Chronic kidney disease, stage 3a: Secondary | ICD-10-CM | POA: Diagnosis not present

## 2023-03-03 DIAGNOSIS — L709 Acne, unspecified: Secondary | ICD-10-CM | POA: Diagnosis not present

## 2023-03-04 ENCOUNTER — Ambulatory Visit
Admission: RE | Admit: 2023-03-04 | Discharge: 2023-03-04 | Disposition: A | Discharge status: Home / Self Care | Payer: Medicare Other | Source: Ambulatory Visit | Attending: Family Medicine | Admitting: Family Medicine

## 2023-03-04 DIAGNOSIS — M8588 Other specified disorders of bone density and structure, other site: Secondary | ICD-10-CM | POA: Diagnosis not present

## 2023-03-04 DIAGNOSIS — E349 Endocrine disorder, unspecified: Secondary | ICD-10-CM | POA: Diagnosis not present

## 2023-03-31 DIAGNOSIS — H353131 Nonexudative age-related macular degeneration, bilateral, early dry stage: Secondary | ICD-10-CM | POA: Diagnosis not present

## 2023-11-26 DIAGNOSIS — M8588 Other specified disorders of bone density and structure, other site: Secondary | ICD-10-CM | POA: Diagnosis not present

## 2023-11-26 DIAGNOSIS — Z0000 Encounter for general adult medical examination without abnormal findings: Secondary | ICD-10-CM | POA: Diagnosis not present

## 2023-11-26 DIAGNOSIS — K219 Gastro-esophageal reflux disease without esophagitis: Secondary | ICD-10-CM | POA: Diagnosis not present

## 2023-11-26 DIAGNOSIS — R5383 Other fatigue: Secondary | ICD-10-CM | POA: Diagnosis not present

## 2023-11-26 DIAGNOSIS — E7800 Pure hypercholesterolemia, unspecified: Secondary | ICD-10-CM | POA: Diagnosis not present

## 2023-11-26 DIAGNOSIS — I129 Hypertensive chronic kidney disease with stage 1 through stage 4 chronic kidney disease, or unspecified chronic kidney disease: Secondary | ICD-10-CM | POA: Diagnosis not present

## 2023-11-26 DIAGNOSIS — N1831 Chronic kidney disease, stage 3a: Secondary | ICD-10-CM | POA: Diagnosis not present

## 2023-11-26 DIAGNOSIS — H3530 Unspecified macular degeneration: Secondary | ICD-10-CM | POA: Diagnosis not present

## 2024-04-20 ENCOUNTER — Telehealth: Payer: Self-pay | Admitting: Family Medicine

## 2024-04-20 DIAGNOSIS — E782 Mixed hyperlipidemia: Secondary | ICD-10-CM

## 2024-04-20 NOTE — Progress Notes (Signed)
 Pharmacy Quality Measure Review  This patient is appearing on a report for being at risk of failing the adherence measure for cholesterol (statin) medications this calendar year.   Medication: Pravastatin 80mg  Last fill date: 04/09 for 100 day supply  Granddaughter of patient answered phone. Patient and patient's daughter (granddaughter's mother) were not available at the time of the call. Granddaughter stated that patient manages her own medication.  PCP is outside of [facility]), therefore unable to route note to discuss refills and adherence to pravastatin for future medication adherence follow-up.  Angela Baalmann, PharmD [facility] Pharmacist
